# Patient Record
Sex: Female | Born: 2000 | Race: Black or African American | Hispanic: No | Marital: Single | Smoking: Never smoker
Health system: Southern US, Community
[De-identification: ages and names within clinical notes are randomized; demographics above are authoritative.]

## PROBLEM LIST (undated history)

## (undated) HISTORY — PX: OTHER SURGICAL HISTORY: SHX169

## (undated) HISTORY — PX: EYE SURGERY: SHX253

## (undated) HISTORY — PX: TONSILLECTOMY: SUR1361

---
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
## 2020-01-13 ENCOUNTER — Other Ambulatory Visit: Payer: Self-pay

## 2020-01-13 ENCOUNTER — Encounter: Payer: BC Managed Care – PPO | Admitting: Family

## 2020-01-19 ENCOUNTER — Ambulatory Visit: Payer: BC Managed Care – PPO | Admitting: Allergy

## 2020-02-17 ENCOUNTER — Other Ambulatory Visit: Payer: Self-pay

## 2020-02-17 ENCOUNTER — Ambulatory Visit (INDEPENDENT_AMBULATORY_CARE_PROVIDER_SITE_OTHER): Payer: BC Managed Care – PPO | Admitting: Family

## 2020-02-17 VITALS — BP 111/66 | HR 73 | Temp 97.9°F | Resp 16 | Ht 69.0 in | Wt 197.0 lb

## 2020-02-17 DIAGNOSIS — Z0000 Encounter for general adult medical examination without abnormal findings: Secondary | ICD-10-CM

## 2020-02-17 DIAGNOSIS — J358 Other chronic diseases of tonsils and adenoids: Secondary | ICD-10-CM

## 2020-02-17 MED ORDER — BENZOYL PEROXIDE-ERYTHROMYCIN 5-3 % EX GEL: Freq: Two times a day (BID) | CUTANEOUS | 3 refills | Status: DC

## 2020-02-17 NOTE — Progress Notes (Signed)
Subjective:    Patient ID: Elizabeth Harmon, female    DOB: [birth year], 19 y.o.   MRN: [id number]  HPI  Patient is a 19 yr old female who presents today for cpx.  Immunizations: declines flu Diet: fair Wt Readings from Last 3 Encounters:  02/17/20 197 lb (89.4 kg) (97 %, Z= 1.91)*  10/01/19 207 lb (93.9 kg) (98 %, Z= 2.05)*  01/14/19 187 lb 9.6 oz (85.1 kg) (96 %, Z= 1.80)*   * Growth percentiles are based on CDC (Girls, 2-20 Years) data.   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  tonsil stones, hurts to swallow sometimes. Would like further evaluation.   Review of Systems  Constitutional: Negative for unexpected weight change.  HENT: Negative for hearing loss and rhinorrhea.   Eyes: Negative for visual disturbance.  Respiratory: Negative for cough and shortness of breath.   Cardiovascular: Negative for chest pain.  Gastrointestinal: Negative for constipation and diarrhea.  Genitourinary: Negative for dysuria, frequency and hematuria.  Musculoskeletal: Negative for arthralgias and myalgias.  Skin: Negative for rash.  Neurological: Positive for headaches (reports headaches every other day).  Hematological: Negative for adenopathy.  Psychiatric/Behavioral:       Denies depression/anxiety       No past medical history on file.   Social History   Socioeconomic History  . Marital status: Single    Spouse name: Not on file  . Number of children: Not on file  . Years of education: Not on file  . Highest education level: Not on file  Occupational History  . Not on file  Tobacco Use  . Smoking status: Never Smoker  . Smokeless tobacco: Never Used  Vaping Use  . Vaping Use: Never used  Substance and Sexual Activity  . Alcohol use: No  . Drug use: No  . Sexual activity: Not on file  Other Topics Concern  . Not on file  Social  History Narrative      Dad lives at home   She is a Consulting civil engineer at [employer], she is studying biochemistry   Enjoys television, basketball, reading   Does well in school   Social Determinants of Health   Financial Resource Strain:   . Difficulty of Paying Living Expenses: Not on file  Food Insecurity:   . Worried About Programme researcher, broadcasting/film/video in the Last Year: Not on file  . Ran Out of Food in the Last Year: Not on file  Transportation Needs:   . Lack of Transportation (Medical): Not on file  . Lack of Transportation (Non-Medical): Not on file  Physical Activity:   . Days of Exercise per Week: Not on file  . Minutes of Exercise per Session: Not on file  Stress:   . Feeling of Stress : Not on file  Social Connections:   . Frequency of Communication with Friends and Family: Not on file  . Frequency of Social Gatherings with Friends and Family: Not on file  . Attends Religious Services: Not on file  . Active Member of Clubs or Organizations: Not on file  . Attends Banker Meetings: Not on file  . Marital Status: Not on file  Intimate Partner Violence:   . Fear of Current or Ex-Partner: Not on file  . Emotionally Abused: Not on file  . Physically Abused: Not on file  . Sexually Abused: Not on file  Past Surgical History:  Procedure Laterality Date  . EYE SURGERY    . OTHER SURGICAL HISTORY Bilateral    tear duct     Family History  Problem Relation Age of Onset  . Hyperthyroidism Mother   . Diabetes Father   . Hyperlipidemia Father   . Hypertension Father   . Heart attack Maternal Uncle   . Heart attack Paternal Uncle   . Cancer Paternal Uncle        lung  . Cancer Paternal Aunt        lung  . Cancer Maternal Grandfather        prostate  . Arthritis Maternal Grandfather   . Sudden death Neg Hx     No Known Allergies  Current Outpatient Medications on File Prior to Visit  Medication Sig Dispense Refill  . norethindrone-ethinyl estradiol (TARINA FE  1/20) 1-20 MG-MCG tablet Take 1 tablet by mouth daily.    Marland Kitchen EPINEPHrine 0.3 mg/0.3 mL IJ SOAJ injection Inject 0.3 mLs (0.3 mg total) into the muscle as needed for anaphylaxis. 2 each 0   No current facility-administered medications on file prior to visit.    BP 111/66 (BP Location: Left Arm, Patient Position: Sitting, Cuff Size: Small)   Pulse 73   Temp 97.9 F (36.6 C) (Oral)   Resp 16   Ht 5\' 9"  (1.753 m)   Wt 197 lb (89.4 kg)   SpO2 100%   BMI 29.09 kg/m    Objective:   Physical Exam  Physical Exam  Constitutional: She is oriented to person, place, and time. She appears well-developed and well-nourished. No distress.  HENT:  Head: Normocephalic and atraumatic.  Right Ear: Tympanic membrane and ear canal normal.  Left Ear: Tympanic membrane and ear canal normal.  Mouth/Throat: Oropharynx is clear and moist. 2-3+ tonsils bilaterally- no tonsil stones noted today Eyes: Pupils are equal, round, and reactive to light. No scleral icterus.  Neck: Normal range of motion. No thyromegaly present.  Cardiovascular: Normal rate and regular rhythm.   No murmur heard. Pulmonary/Chest: Effort normal and breath sounds normal. No respiratory distress. He has no wheezes. She has no rales. She exhibits no tenderness.  Abdominal: Soft. Bowel sounds are normal. She exhibits no distension and no mass. There is no tenderness. There is no rebound and no guarding.  Musculoskeletal: She exhibits no edema.  Lymphadenopathy:    She has no cervical adenopathy.  Neurological: She is alert and oriented to person, place, and time. She has normal patellar reflexes. She exhibits normal muscle tone. Coordination normal.  Skin: Skin is warm and dry.  Psychiatric: She has a normal mood and affect. Her behavior is normal. Judgment and thought content normal.  Breast/pelvic: deferred       Assessment & Plan:  Preventative care- discussed healthy diet, encouraged her to continue regular exercise. Pt  counseled on the importance of covid vaccination.  I think that her skipping meals may be contributing to her headaches. Reports that she had chlamydia/gonorrhea screen at school last month and it was negative.   Tonsil stone- refer to ENT in [place] near her college.   This visit occurred during the SARS-CoV-2 public health emergency.  Safety protocols were in place, including screening questions prior to the visit, additional usage of staff PPE, and extensive cleaning of exam room while observing appropriate contact time as indicated for disinfecting solutions.         Assessment & Plan:

## 2020-02-17 NOTE — Patient Instructions (Signed)
Preventive Care 18-19 Years Old, Female Preventive care refers to lifestyle choices and visits with your health care provider that can promote health and wellness. At this stage in your life, you may start seeing a primary care physician instead of a pediatrician. Your health care is now your responsibility. Preventive care for young adults includes:  A yearly physical exam. This is also called an annual wellness visit.  Regular dental and eye exams.  Immunizations.  Screening for certain conditions.  Healthy lifestyle choices, such as diet and exercise. What can I expect for my preventive care visit? Physical exam Your health care provider may check:  Height and weight. These may be used to calculate body mass index (BMI), which is a measurement that tells if you are at a healthy weight.  Heart rate and blood pressure.  Body temperature. Counseling Your health care provider may ask you questions about:  Past medical problems and family medical history.  Alcohol, tobacco, and drug use.  Home and relationship well-being.  Access to firearms.  Emotional well-being.  Diet, exercise, and sleep habits.  Sexual activity and sexual health.  Method of birth control.  Menstrual cycle.  Pregnancy history. What immunizations do I need?  Influenza (flu) vaccine  This is recommended every year. Tetanus, diphtheria, and pertussis (Tdap) vaccine  You may need a Td booster every 10 years. Varicella (chickenpox) vaccine  You may need this vaccine if you have not already been vaccinated. Human papillomavirus (HPV) vaccine  If recommended by your health care provider, you may need three doses over 6 months. Measles, mumps, and rubella (MMR) vaccine  You may need at least one dose of MMR. You may also need a second dose. Meningococcal conjugate (MenACWY) vaccine  One dose is recommended if you are 19-19 years old and a first-year college student living in a residence hall,  or if you have one of several medical conditions. You may also need additional booster doses. Pneumococcal conjugate (PCV13) vaccine  You may need this if you have certain conditions and were not previously vaccinated. Pneumococcal polysaccharide (PPSV23) vaccine  You may need one or two doses if you smoke cigarettes or if you have certain conditions. Hepatitis A vaccine  You may need this if you have certain conditions or if you travel or work in places where you may be exposed to hepatitis A. Hepatitis B vaccine  You may need this if you have certain conditions or if you travel or work in places where you may be exposed to hepatitis B. Haemophilus influenzae type b (Hib) vaccine  You may need this if you have certain risk factors. You may receive vaccines as individual doses or as more than one vaccine together in one shot (combination vaccines). Talk with your health care provider about the risks and benefits of combination vaccines. What tests do I need? Blood tests  Lipid and cholesterol levels. These may be checked every 5 years starting at age 20.  Hepatitis C test.  Hepatitis B test. Screening  Pelvic exam and Pap test. This may be done every 3 years starting at age 19.  Sexually transmitted disease (STD) testing, if you are at risk.  BRCA-related cancer screening. This may be done if you have a family history of breast, ovarian, tubal, or peritoneal cancers. Other tests  Tuberculosis skin test.  Vision and hearing tests.  Skin exam.  Breast exam. Follow these instructions at home: Eating and drinking   Eat a diet that includes fresh fruits and   vegetables, whole grains, lean protein, and low-fat dairy products.  Drink enough fluid to keep your urine pale yellow.  Do not drink alcohol if: ? Your health care provider tells you not to drink. ? You are pregnant, may be pregnant, or are planning to become pregnant. ? You are under the legal drinking age. In the  [place] the legal drinking age is 36.  If you drink alcohol: ? Limit how much you have to 0-1 drink a day. ? Be aware of how much alcohol is in your drink. In the [place] one drink equals one 12 oz bottle of beer (355 mL), one 5 oz glass of wine (148 mL), or one 1 oz glass of hard liquor (44 mL). Lifestyle  Take daily care of your teeth and gums.  Stay active. Exercise at least 30 minutes 5 or more days of the week.  Do not use any products that contain nicotine or tobacco, such as cigarettes, e-cigarettes, and chewing tobacco. If you need help quitting, ask your health care provider.  Do not use drugs.  If you are sexually active, practice safe sex. Use a condom or other form of birth control (contraception) in order to prevent pregnancy and STIs (sexually transmitted infections). If you plan to become pregnant, see your health care provider for a pre-conception visit.  Find healthy ways to cope with stress, such as: ? Meditation, yoga, or listening to music. ? Journaling. ? Talking to a trusted person. ? Spending time with friends and family. Safety  Always wear your seat belt while driving or riding in a vehicle.  Do not drive if you have been drinking alcohol. Do not ride with someone who has been drinking.  Do not drive when you are tired or distracted. Do not text while driving.  Wear a helmet and other protective equipment during sports activities.  If you have firearms in your house, make sure you follow all gun safety procedures.  Seek help if you have been bullied, physically abused, or sexually abused.  Use the Internet responsibly to avoid dangers such as online bullying and online sex predators. What's next?  Go to your health care provider once a year for a well check visit.  Ask your health care provider how often you should have your eyes and teeth checked.  Stay up to date on all vaccines. This information is not intended to replace advice given to you by  your health care provider. Make sure you discuss any questions you have with your health care provider. Document Revised: 04/01/2018 Document Reviewed: 04/01/2018 Elsevier Patient Education  2020 [employer].

## 2020-04-03 DIAGNOSIS — J351 Hypertrophy of tonsils: Secondary | ICD-10-CM | POA: Diagnosis not present

## 2020-04-03 DIAGNOSIS — J358 Other chronic diseases of tonsils and adenoids: Secondary | ICD-10-CM | POA: Diagnosis not present

## 2020-04-06 ENCOUNTER — Other Ambulatory Visit: Payer: Self-pay | Admitting: Otolaryngology

## 2020-04-07 DIAGNOSIS — M7542 Impingement syndrome of left shoulder: Secondary | ICD-10-CM | POA: Diagnosis not present

## 2020-04-11 ENCOUNTER — Other Ambulatory Visit: Payer: Self-pay

## 2020-04-11 ENCOUNTER — Encounter (HOSPITAL_BASED_OUTPATIENT_CLINIC_OR_DEPARTMENT_OTHER): Payer: Self-pay | Admitting: Otolaryngology

## 2020-04-16 ENCOUNTER — Inpatient Hospital Stay (HOSPITAL_COMMUNITY)
Admission: RE | Admit: 2020-04-16 | Discharge: 2020-04-16 | Disposition: A | Discharge status: Home / Self Care | Payer: BC Managed Care – PPO | Source: Ambulatory Visit

## 2020-04-18 NOTE — Progress Notes (Signed)
Pt came through testing drive thru 57/26/20 for pre procedure covid testing. 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 Copy of test forwarded to Gae Dry, RN at [facility].

## 2020-04-18 NOTE — Anesthesia Preprocedure Evaluation (Addendum)
Anesthesia Evaluation  Patient identified by MRN, date of birth, ID band Patient awake    Reviewed: Allergy & Precautions, NPO status , Patient's Chart, lab work & pertinent test results  Airway Mallampati: II  TM Distance: >3 FB Neck ROM: Full    Dental no notable dental hx. (+) Teeth Intact, Dental Advisory Given   Pulmonary neg pulmonary ROS,    Pulmonary exam normal breath sounds clear to auscultation       Cardiovascular Exercise Tolerance: Good negative cardio ROS Normal cardiovascular exam Rhythm:Regular Rate:Normal     Neuro/Psych  Headaches, negative psych ROS   GI/Hepatic negative GI ROS, Neg liver ROS,   Endo/Other  negative endocrine ROS  Renal/GU negative Renal ROS     Musculoskeletal negative musculoskeletal ROS (+)   Abdominal (+) - obese,   Peds  Hematology negative hematology ROS (+)   Anesthesia Other Findings   Reproductive/Obstetrics                            Anesthesia Physical Anesthesia Plan  ASA: II  Anesthesia Plan: General   Post-op Pain Management:    Induction: Intravenous  PONV Risk Score and Plan: Treatment may vary due to age or medical condition  Airway Management Planned: Oral ETT  Additional Equipment: None  Intra-op Plan:   Post-operative Plan: Extubation in OR  Informed Consent: I have reviewed the patients History and Physical, chart, labs and discussed the procedure including the risks, benefits and alternatives for the proposed anesthesia with the patient or authorized representative who has indicated his/her understanding and acceptance.     Dental advisory given  Plan Discussed with: CRNA and Anesthesiologist  Anesthesia Plan Comments:        Anesthesia Quick Evaluation

## 2020-04-19 ENCOUNTER — Ambulatory Visit (HOSPITAL_BASED_OUTPATIENT_CLINIC_OR_DEPARTMENT_OTHER): Payer: BC Managed Care – PPO | Admitting: Anesthesiology

## 2020-04-19 ENCOUNTER — Other Ambulatory Visit: Payer: Self-pay

## 2020-04-19 ENCOUNTER — Encounter (HOSPITAL_BASED_OUTPATIENT_CLINIC_OR_DEPARTMENT_OTHER): Payer: Self-pay | Admitting: Otolaryngology

## 2020-04-19 ENCOUNTER — Ambulatory Visit (HOSPITAL_BASED_OUTPATIENT_CLINIC_OR_DEPARTMENT_OTHER)
Admission: RE | Admit: 2020-04-19 | Discharge: 2020-04-19 | Disposition: A | Discharge status: Home / Self Care | Payer: BC Managed Care – PPO | Attending: Otolaryngology | Admitting: Otolaryngology

## 2020-04-19 ENCOUNTER — Encounter (HOSPITAL_BASED_OUTPATIENT_CLINIC_OR_DEPARTMENT_OTHER)
Admission: RE | Disposition: A | Discharge status: Home / Self Care | Payer: Self-pay | Source: Home / Self Care | Attending: Otolaryngology

## 2020-04-19 DIAGNOSIS — Z7952 Long term (current) use of systemic steroids: Secondary | ICD-10-CM | POA: Insufficient documentation

## 2020-04-19 DIAGNOSIS — Z79899 Other long term (current) drug therapy: Secondary | ICD-10-CM | POA: Insufficient documentation

## 2020-04-19 DIAGNOSIS — J3501 Chronic tonsillitis: Secondary | ICD-10-CM | POA: Insufficient documentation

## 2020-04-19 DIAGNOSIS — Z793 Long term (current) use of hormonal contraceptives: Secondary | ICD-10-CM | POA: Insufficient documentation

## 2020-04-19 DIAGNOSIS — G43909 Migraine, unspecified, not intractable, without status migrainosus: Secondary | ICD-10-CM | POA: Diagnosis not present

## 2020-04-19 DIAGNOSIS — J351 Hypertrophy of tonsils: Secondary | ICD-10-CM | POA: Diagnosis not present

## 2020-04-19 HISTORY — PX: TONSILLECTOMY: SHX5217

## 2020-04-19 LAB — POCT PREGNANCY, URINE: Preg Test, Ur: NEGATIVE

## 2020-04-19 SURGERY — TONSILLECTOMY
Anesthesia: General | Site: Mouth | Laterality: Bilateral

## 2020-04-19 MED ORDER — LIDOCAINE 2% (20 MG/ML) 5 ML SYRINGE
INTRAMUSCULAR | Status: AC
  Filled 2020-04-19: qty 5

## 2020-04-19 MED ORDER — MIDAZOLAM HCL 2 MG/2ML IJ SOLN
INTRAMUSCULAR | Status: AC
  Filled 2020-04-19: qty 2

## 2020-04-19 MED ORDER — HYDROMORPHONE HCL 1 MG/ML IJ SOLN
INTRAMUSCULAR | Status: AC
  Filled 2020-04-19: qty 0.5

## 2020-04-19 MED ORDER — DOCUSATE SODIUM 100 MG PO CAPS: 100.0000 mg | ORAL_CAPSULE | Freq: Every day | ORAL | 2 refills | Status: DC | PRN

## 2020-04-19 MED ORDER — FENTANYL CITRATE (PF) 100 MCG/2ML IJ SOLN
INTRAMUSCULAR | Status: AC
  Filled 2020-04-19: qty 2

## 2020-04-19 MED ORDER — DEXMEDETOMIDINE (PRECEDEX) IN NS 20 MCG/5ML (4 MCG/ML) IV SYRINGE
PREFILLED_SYRINGE | INTRAVENOUS | Status: AC
  Filled 2020-04-19: qty 5

## 2020-04-19 MED ORDER — ROCURONIUM BROMIDE 100 MG/10ML IV SOLN
INTRAVENOUS | Status: DC | PRN
  Administered 2020-04-19: 60 mg via INTRAVENOUS

## 2020-04-19 MED ORDER — PROPOFOL 10 MG/ML IV BOLUS
INTRAVENOUS | Status: DC | PRN
  Administered 2020-04-19: 200 mg via INTRAVENOUS

## 2020-04-19 MED ORDER — OXYMETAZOLINE HCL 0.05 % NA SOLN
NASAL | Status: AC
  Filled 2020-04-19: qty 30

## 2020-04-19 MED ORDER — FENTANYL CITRATE (PF) 100 MCG/2ML IJ SOLN
INTRAMUSCULAR | Status: DC | PRN
  Administered 2020-04-19: 100 ug via INTRAVENOUS

## 2020-04-19 MED ORDER — ACETAMINOPHEN 500 MG PO TABS
ORAL_TABLET | ORAL | Status: AC
  Filled 2020-04-19: qty 2

## 2020-04-19 MED ORDER — BACITRACIN-NEOMYCIN-POLYMYXIN OINTMENT TUBE
TOPICAL_OINTMENT | CUTANEOUS | Status: AC
  Filled 2020-04-19: qty 14.17

## 2020-04-19 MED ORDER — HYDROCODONE-ACETAMINOPHEN 7.5-325 MG/15ML PO SOLN: 10.0000 mL | ORAL | Status: DC | PRN

## 2020-04-19 MED ORDER — OXYCODONE HCL 5 MG/5ML PO SOLN: 5.0000 mg | Freq: Once | ORAL | Status: DC | PRN

## 2020-04-19 MED ORDER — ONDANSETRON HCL 4 MG/2ML IJ SOLN
INTRAMUSCULAR | Status: AC
  Filled 2020-04-19: qty 2

## 2020-04-19 MED ORDER — MIDAZOLAM HCL 5 MG/5ML IJ SOLN
INTRAMUSCULAR | Status: DC | PRN
  Administered 2020-04-19: 2 mg via INTRAVENOUS

## 2020-04-19 MED ORDER — MEPERIDINE HCL 25 MG/ML IJ SOLN
6.2500 mg | INTRAMUSCULAR | Status: DC | PRN
Start: 2020-04-19 — End: 2020-04-19

## 2020-04-19 MED ORDER — HYDROCODONE-ACETAMINOPHEN 7.5-325 MG/15ML PO SOLN: 10.0000 mL | ORAL | 0 refills | Status: AC | PRN

## 2020-04-19 MED ORDER — DEXAMETHASONE SODIUM PHOSPHATE 4 MG/ML IJ SOLN
INTRAMUSCULAR | Status: DC | PRN
  Administered 2020-04-19: 10 mg via INTRAVENOUS

## 2020-04-19 MED ORDER — BUPIVACAINE HCL (PF) 0.25 % IJ SOLN
INTRAMUSCULAR | Status: AC
  Filled 2020-04-19: qty 90

## 2020-04-19 MED ORDER — ACETAMINOPHEN 500 MG PO TABS
1000.0000 mg | ORAL_TABLET | Freq: Once | ORAL | Status: AC
  Administered 2020-04-19: 1000 mg via ORAL

## 2020-04-19 MED ORDER — ONDANSETRON HCL 4 MG/2ML IJ SOLN
INTRAMUSCULAR | Status: DC | PRN
  Administered 2020-04-19: 4 mg via INTRAVENOUS

## 2020-04-19 MED ORDER — SUGAMMADEX SODIUM 200 MG/2ML IV SOLN
INTRAVENOUS | Status: DC | PRN
  Administered 2020-04-19: 200 mg via INTRAVENOUS

## 2020-04-19 MED ORDER — HYDROMORPHONE HCL 1 MG/ML IJ SOLN
0.2500 mg | INTRAMUSCULAR | Status: DC | PRN
  Administered 2020-04-19: 0.5 mg via INTRAVENOUS

## 2020-04-19 MED ORDER — AMISULPRIDE (ANTIEMETIC) 5 MG/2ML IV SOLN: 10.0000 mg | Freq: Once | INTRAVENOUS | Status: DC | PRN

## 2020-04-19 MED ORDER — DEXAMETHASONE SODIUM PHOSPHATE 10 MG/ML IJ SOLN
INTRAMUSCULAR | Status: AC
  Filled 2020-04-19: qty 1

## 2020-04-19 MED ORDER — ONDANSETRON HCL 4 MG/2ML IJ SOLN: 4.0000 mg | Freq: Once | INTRAMUSCULAR | Status: DC | PRN

## 2020-04-19 MED ORDER — LACTATED RINGERS IV SOLN: INTRAVENOUS | Status: DC

## 2020-04-19 MED ORDER — LIDOCAINE HCL (CARDIAC) PF 100 MG/5ML IV SOSY
PREFILLED_SYRINGE | INTRAVENOUS | Status: DC | PRN
  Administered 2020-04-19: 100 mg via INTRAVENOUS

## 2020-04-19 MED ORDER — OXYCODONE HCL 5 MG PO TABS: 5.0000 mg | ORAL_TABLET | Freq: Once | ORAL | Status: DC | PRN

## 2020-04-19 MED ORDER — DEXMEDETOMIDINE HCL 200 MCG/2ML IV SOLN
INTRAVENOUS | Status: DC | PRN
  Administered 2020-04-19: 8 ug via INTRAVENOUS
  Administered 2020-04-19: 12 ug via INTRAVENOUS

## 2020-04-19 MED ORDER — PROPOFOL 10 MG/ML IV BOLUS
INTRAVENOUS | Status: AC
  Filled 2020-04-19: qty 40

## 2020-04-19 MED ORDER — IBUPROFEN 100 MG/5ML PO SUSP: 400.0000 mg | Freq: Four times a day (QID) | ORAL | Status: DC | PRN

## 2020-04-19 SURGICAL SUPPLY — 32 items
CANISTER SUCT 1200ML W/VALVE (MISCELLANEOUS) ×2 IMPLANT
CATH ROBINSON RED A/P 12FR (CATHETERS) ×2 IMPLANT
CLEANER CAUTERY TIP 5X5 PAD (MISCELLANEOUS) IMPLANT
COAGULATOR SUCT SWTCH 10FR 6 (ELECTROSURGICAL) ×2 IMPLANT
COVER BACK TABLE 60X90IN (DRAPES) ×2 IMPLANT
COVER MAYO STAND STRL (DRAPES) ×2 IMPLANT
COVER WAND RF STERILE (DRAPES) IMPLANT
ELECT COATED BLADE 2.86 ST (ELECTRODE) ×2 IMPLANT
ELECT REM PT RETURN 9FT ADLT (ELECTROSURGICAL) ×2
ELECT REM PT RETURN 9FT PED (ELECTROSURGICAL)
ELECTRODE REM PT RETRN 9FT PED (ELECTROSURGICAL) IMPLANT
ELECTRODE REM PT RTRN 9FT ADLT (ELECTROSURGICAL) ×1 IMPLANT
GAUZE SPONGE 4X4 12PLY STRL LF (GAUZE/BANDAGES/DRESSINGS) ×4 IMPLANT
GLOVE BIOGEL PI IND STRL 7.0 (GLOVE) IMPLANT
GLOVE BIOGEL PI INDICATOR 7.0 (GLOVE) ×1
GLOVE SURG ENC MOIS LTX SZ6.5 (GLOVE) ×2 IMPLANT
GOWN STRL REUS W/ TWL LRG LVL3 (GOWN DISPOSABLE) ×2 IMPLANT
GOWN STRL REUS W/TWL LRG LVL3 (GOWN DISPOSABLE) ×4
MARKER SKIN DUAL TIP RULER LAB (MISCELLANEOUS) IMPLANT
NS IRRIG 1000ML POUR BTL (IV SOLUTION) ×2 IMPLANT
PAD CLEANER CAUTERY TIP 5X5 (MISCELLANEOUS)
PENCIL SMOKE EVACUATOR (MISCELLANEOUS) ×2 IMPLANT
SHEET MEDIUM DRAPE 40X70 STRL (DRAPES) ×2 IMPLANT
SOLUTION BUTLER CLEAR DIP (MISCELLANEOUS) ×2 IMPLANT
SPONGE TONSIL TAPE 1 RFD (DISPOSABLE) IMPLANT
SPONGE TONSIL TAPE 1.25 RFD (DISPOSABLE) ×1 IMPLANT
SYR BULB EAR ULCER 3OZ GRN STR (SYRINGE) ×2 IMPLANT
TOWEL GREEN STERILE FF (TOWEL DISPOSABLE) ×2 IMPLANT
TUBE CONNECTING 20X1/4 (TUBING) ×2 IMPLANT
TUBE SALEM SUMP 12R W/ARV (TUBING) IMPLANT
TUBE SALEM SUMP 16 FR W/ARV (TUBING) ×1 IMPLANT
YANKAUER SUCT BULB TIP NO VENT (SUCTIONS) ×2 IMPLANT

## 2020-04-19 NOTE — H&P (Signed)
Elizabeth Harmon is an 19 y.o. female.    Chief Complaint:  Chronic tonsillitis  HPI: Patient presents today for planned elective procedure.  She was most recently seen in our office approximately 2 weeks ago with complaints of.  Recurrent sore throat and tonsillitis.  She also suffers with frequent halitosis and odynophagia secondary to tonsil stones.  She denies any change to her history.  History reviewed. No pertinent past medical history.  Past Surgical History:  Procedure Laterality Date  . EYE SURGERY    . OTHER SURGICAL HISTORY Bilateral    tear duct     Family History  Problem Relation Age of Onset  . Hyperthyroidism Mother   . Diabetes Father   . Hyperlipidemia Father   . Hypertension Father   . Heart attack Maternal Uncle   . Heart attack Paternal Uncle   . Cancer Paternal Uncle        lung  . Cancer Paternal Aunt        lung  . Cancer Maternal Grandfather        prostate  . Arthritis Maternal Grandfather   . Sudden death Neg Hx     Social History:  reports that she has never smoked. She has never used smokeless tobacco. She reports that she does not drink alcohol and does not use drugs.  Allergies: No Known Allergies  Medications Prior to Admission  Medication Sig Dispense Refill  . naproxen (NAPROSYN) 500 MG tablet Take 500 mg by mouth 2 (two) times daily with a meal.    . norethindrone-ethinyl estradiol (LOESTRIN FE) 1-20 MG-MCG tablet Take 1 tablet by mouth daily.    . predniSONE (DELTASONE) 20 MG tablet Take 20 mg by mouth 2 (two) times daily with a meal.    . benzoyl peroxide-erythromycin (BENZAMYCIN) gel Apply topically 2 (two) times daily. 23.3 g 3  . EPINEPHrine 0.3 mg/0.3 mL IJ SOAJ injection Inject 0.3 mLs (0.3 mg total) into the muscle as needed for anaphylaxis. 2 each 0    Results for orders placed or performed during the hospital encounter of 04/19/20 (from the past 48 hour(s))  Pregnancy, urine POC     Status: None   Collection Time: 04/19/20   6:53 AM  Result Value Ref Range   Preg Test, Ur NEGATIVE NEGATIVE    Comment:        THE SENSITIVITY OF THIS METHODOLOGY IS >24 mIU/mL    No results found.  ROS: ROS  Blood pressure 121/67, pulse 83, temperature 98.9 F (37.2 C), temperature source Oral, resp. rate 16, height 5\' 9"  (1.753 m), weight 90.5 kg, last menstrual period 04/12/2020, SpO2 100 %.  PHYSICAL EXAM: Constitutional:  Patient appears well-nourished and well-developed. No acute distress. Head/Face: Facial features are symmetric. Skull is normocephalic.  Eyes: Conjunctiva and lids are normal. Normal extraocular mobility. Normal vision by patient report. Ears:  Right: Pinna and external meatus normal,  Left: Pinna and external meatus normal Oral cavity/Oropharynx: Lips normal, teeth and gums normal with good dentition, normal oral vestibule. Normal floor of mouth, tongue and oral mucosa, no mucosal lesions, ulcer or mass, normal tongue mobility.  Hard and soft palate normal with normal mobility. 3+ tonsillar hypertrophy, moderately cryptic. No erythema or exudate. Neck: No cervical lymphadenopathy, mass or swelling. Neurological: Alert and oriented to self, place and time. Normal reflexes and motor skills, balance and coordination. Psychiatric: No unusual anxiety or evidence of depression. Appropriate affect.   Studies Reviewed: None   Assessment/Plan Elizabeth Harmon is a  19 y/o F with chronic tonsillitis, tonsillar hypertrophy  -To OR today for planned surgical procedure.  Risk benefits and expected recovery discussed with patient.  She is agreeable.  Consent signed and placed in chart.    Elizabeth Harmon A Laronn Devonshire 04/19/2020, 8:08 AM

## 2020-04-19 NOTE — Anesthesia Postprocedure Evaluation (Signed)
Anesthesia Post Note  Patient: Elizabeth Harmon  Procedure(s) Performed: TONSILLECTOMY (Bilateral Mouth)     Patient location during evaluation: PACU Anesthesia Type: General Level of consciousness: awake and alert Pain management: pain level controlled Vital Signs Assessment: post-procedure vital signs reviewed and stable Respiratory status: spontaneous breathing, nonlabored ventilation, respiratory function stable and patient connected to nasal cannula oxygen Cardiovascular status: blood pressure returned to baseline and stable Postop Assessment: no apparent nausea or vomiting Anesthetic complications: no   No complications documented.  Last Vitals:  Vitals:   04/19/20 0945 04/19/20 1000  BP: 124/73 118/66  Pulse: 87 79  Resp: 13 15  Temp:    SpO2: 99% 99%    Last Pain:  Vitals:   04/19/20 1015  TempSrc:   PainSc: 0-No pain                 Trevor Iha

## 2020-04-19 NOTE — Transfer of Care (Signed)
Immediate Anesthesia Transfer of Care Note  Patient: Elizabeth Harmon  Procedure(s) Performed: TONSILLECTOMY (Bilateral Mouth)  Patient Location: PACU  Anesthesia Type:General  Level of Consciousness: awake, alert  and oriented  Airway & Oxygen Therapy: Patient Spontanous Breathing and Patient connected to face mask oxygen  Post-op Assessment: Report given to RN and Post -op Vital signs reviewed and stable  Post vital signs: Reviewed and stable  Last Vitals:  Vitals Value Taken Time  BP 118/66 04/19/20 1000  Temp 36.6 C 04/19/20 0906  Pulse 73 04/19/20 1000  Resp 18 04/19/20 1000  SpO2 100 % 04/19/20 1000  Vitals shown include unvalidated device data.  Last Pain:  Vitals:   04/19/20 0952  TempSrc:   PainSc: 3       Patients Stated Pain Goal: 1 (04/19/20 0930)  Complications: No complications documented.

## 2020-04-19 NOTE — Anesthesia Procedure Notes (Signed)
Procedure Name: Intubation Performed by: Verita Lamb, CRNA Pre-anesthesia Checklist: Patient identified, Emergency Drugs available, Suction available and Patient being monitored Patient Re-evaluated:Patient Re-evaluated prior to induction Oxygen Delivery Method: Circle system utilized Preoxygenation: Pre-oxygenation with 100% oxygen Induction Type: IV induction Ventilation: Mask ventilation without difficulty Laryngoscope Size: Mac and 3 Grade View: Grade I Tube type: Oral Tube size: 7.0 mm Number of attempts: 1 Airway Equipment and Method: Stylet and Oral airway Placement Confirmation: ETT inserted through vocal cords under direct vision,  positive ETCO2 and breath sounds checked- equal and bilateral Secured at: 22 cm Tube secured with: Tape Dental Injury: Teeth and Oropharynx as per pre-operative assessment

## 2020-04-19 NOTE — Op Note (Signed)
OPERATIVE NOTE  Shiane Wenberg Date/Time of Admission: 04/19/2020  6:36 AM  CSN: [id number];MRN:[id number] Attending Provider: Cheron Schaumann A, DO Room/Bed: [facility]/NONE DOB: 2000/08/30 Age: 19 y.o.   Pre-Op Diagnosis: tonsillar hypertrophy chronic tonsillitis  Post-Op Diagnosis: tonsillar hypertrophy chronic tonsillitis  Procedure: Procedure(s): TONSILLECTOMY  Anesthesia: General  Surgeon(s): Howie Rufus A Analeese Andreatta, DO  Staff: Circulator: Macie Burows, RN Relief Circulator: McDonough-Hughes, Maceo Pro, RN Scrub Person: Patrice Paradise, RN  Implants: * No implants in log *  Specimens: * No specimens in log *  Complications: None  EBL: <5 ML  IVF: See anesthesia log  Condition: stable  Operative Findings:  3+ tonsils, cryptic  Description of Operation: Once operative consent was obtained, and the surgical site confirmed with the operating room team, the patient was brought back to the operating room and general endotracheal anesthesia was obtained. The patient was turned over to the ENT service. A Crow-Davis mouth gag was used to expose the oral cavity and oropharynx. A red rubber catheter was placed from the right nasal cavity to the oral cavity to retract the soft palate. Attention was first turned to the right tonsil, which was excised at the level of the capsule using electrocautery. Hemostasis was obtained and the tonsillar fossa and the tonsil was sent for specimen. The exact procedure was repeated on the left side. The patient was relieved from oral suspension and then placed back in oral suspension to assure hemostasis, which was obtained. An oral gastric tube was placed into the stomach and suctioned to reduce postoperative nausea. The patient was turned back over to the anesthesia service and extubated in the room without event. The patient was then transferred to the PACU in stable condition.    Laren Boom, DO [facility]  04/19/2020

## 2020-04-19 NOTE — Discharge Instructions (Signed)
Next dose of Tylenol can be given after 1:11PM.    Post Anesthesia Home Care Instructions  Activity: Get plenty of rest for the remainder of the day. A responsible individual must stay with you for 24 hours following the procedure.  For the next 24 hours, DO NOT: -Drive a car -Advertising copywriter -Drink alcoholic beverages -Take any medication unless instructed by your physician -Make any legal decisions or sign important papers.  Meals: Start with liquid foods such as gelatin or soup. Progress to regular foods as tolerated. Avoid greasy, spicy, heavy foods. If nausea and/or vomiting occur, drink only clear liquids until the nausea and/or vomiting subsides. Call your physician if vomiting continues.  Special Instructions/Symptoms: Your throat may feel dry or sore from the anesthesia or the breathing tube placed in your throat during surgery. If this causes discomfort, gargle with warm salt water. The discomfort should disappear within 24 hours.  If you had a scopolamine patch placed behind your ear for the management of post- operative nausea and/or vomiting:  1. The medication in the patch is effective for 72 hours, after which it should be removed.  Wrap patch in a tissue and discard in the trash. Wash hands thoroughly with soap and water. 2. You may remove the patch earlier than 72 hours if you experience unpleasant side effects which may include dry mouth, dizziness or visual disturbances. 3. Avoid touching the patch. Wash your hands with soap and water after contact with the patch.        Tonsillectomy Post Operative Instructions   Effects of Anesthesia Tonsillectomy (with or without Adenoidectomy) involves a brief anesthesia,  typically 20 - 60 minutes. Patients may be quite irritable for several hours after  surgery. If sedatives were given, some patients will remain sleepy for much of the  day. Nausea and vomiting is occasionally seen, and usually resolves by the  evening  of surgery - even without additional medications. Medications Tonsillectomy is a painful procedure. Pain medications help but do not  completely alleviate the discomfort.   YOUNGER CHILDREN  Younger children should be given Tylenol Elixir and Motrin Elixir, with  dosing based on weight (see chart below). Start by giving scheduled  Tylenol every 4 hours. If this does not control the pain, you can  ALTERNATE between Tylenol and Motrin and give a dose every 3 hours  (i.e. Tylenol given at 12pm, then Motrin at 3pm then Tylenol at 6pm). Many  children do not like the taste of liquid medications, so you may substitute  Tylenol and Motrin chewables for elixir prescribed. Below are the doses for  both. It is fine to use generic store brands instead of brand name -- [facility] generic has a taste tolerated by most children. You do not  need to wait for your child to complain of pain to give them medication,  scheduled dosing of medications will control the pain more effectively.  OLDER CHILDREN  Older children will be prescribed Lortab Elixir and can use Tylenol Elixir.  You may use ONE OR THE OTHER every 4 hours (DO NOT give them at  the same time). Try giving Tylenol (see chart below for dosing) scheduled  every 4 hours. If the Tylenol Elixir does not help to relieve the pain at all,  then substitute the Lortab Elixir for the next dose. Every time you give a  dose of Lortab Elixir, do so with some food or full liquid to prevent nausea.  The best thing to take with the  medication is a cup of pudding or ice cream,  a milkshake or cup of milk.     ADULTS  Adults will be prescribed a narcotic pain pill or elixir (Percocet, Norco,  Vicodin, Lortab are some examples). Do not use aspirin products (Bayers,  Goode powders, Excedrin) - they may increase the chance of bleeding.  Every time you take a dose of pain medication, do so with some food or full  liquid to prevent nausea. The best thing  to take with the medication is a  cup of pudding or ice cream, a milkshake or cup of milk.   Activity  Vigorous exercise should be avoided for 14 days after surgery. This risk of  bleeding is increased with increased activity and bleeding from where the tonsils  were removed can happen for up to 2 weeks after surgery. Baths and showers are fine. Many patients have reduced energy levels until their pain decreases and  they are taking in more nourishment and calories. You should not travel out of  the local area for a full 2 weeks after surgery in case you experience bleeding  after surgery.   Eating & Drinking Dehydration is the biggest enemy in the recovery period. It will increase the pain,  increase the risk of bleeding and delay the healing. It usually happens because  the pain of swallowing keeps the patient from drinking enough liquids. Therefore,  the key is to force fluids, and that works best when pain control is maximized. You cannot drink too much after having a tonsillectomy. The only drinks to avoid  are citrus like orange and grapefruit juices because they will burn the back of the  throat. Incentive charts with prizes work very well to get young children to drink  fluids and take their medications after surgery. Some patients will have a small  amount of liquid come out of their nose when they drink after surgery, this should  stop within a few weeks after surgery.  Although drinking is more important, eating is fine even the day of surgery but  avoid foods that are crunchy or have sharp edges. Dairy products may be taken,  if desired. You should avoid acidic, salty and spicy foods (especially tomato  sauces). Chewing gum or bubble gum encourages swallowing and saliva flow,  and may even speed up the healing. Almost everyone loses some weight after  tonsillectomy (which is usually regained in the 2nd or 3rd week after surgery).  Drinking is far more important that eating in  the first 14 days after surgery, so  concentrate on that first and foremost. Adequate liquid intake probably speeds  Recovery.  Other things  Pain is usually the worst in the morning; this can be avoided by overnight  medication administration if needed.  Since moisture helps soothe the healing throat, a room humidifier (hot or  cold) is suggested when the patient is sleeping.  Some patients feel pain relief with an ice collar to the neck (or a bag of  frozen peas or corn). Be careful to avoid placing cold plastic directly on the  skin - wrap in a paper towel or washcloth.   If the tonsils and adenoids are very large, the patients voice may change  after surgery.  The recovery from tonsillectomy is a very painful period, often the worst  pain people can recall, so please be understanding and patient with  yourself, or the patient you are caring for. It is helpful to take pain  medicine during the night if the patient awakens-- the worst pain is usually  in the morning. The pain may seem to increase 2-5 days after surgery - this is normal when inflammation sets in. Please be aware that no  combination of medicines will eliminate the pain - the patient will need to  continue eating/drinking in spite of the remaining discomfort.  You should not travel outside of the local area for 14 days after surgery in  case significant bleeding occurs.   What should we expect after surgery? As previously mentioned, most patients have a significant amount of pain after  tonsillectomy, with pain resolving 7-14 days after surgery. Older children and  adults seem to have more discomfort. Most patients can go home the day of  surgery.  Ear pain: Many people will complain of earaches after tonsillectomy. This  is caused by referred pain coming from throat and not the ears. Give pain  medications and encourage liquid intake.  Fever: Many patients have a low-grade fever after tonsillectomy - up to   101.5 degrees (380 C.) for several days. Higher prolonged fever should be  reported to your surgeon.  Bad looking (and bad smelling) throat: After surgery, the place where  the tonsils were removed is covered with a white film, which is a moist  scab. This usually develops 3-5 days after surgery and falls off 10-14 days  after surgery and usually causes bad breath. There will be some redness  and swelling as well. The uvula (the part of the throat that hangs down in  the middle between the tonsils) is usually swollen for several days after  surgery.  Sore/bruised feeling of Tongue: This is common for the first few days  after surgery because the tongue is pushed out of the way to take out the  tonsils in surgery.  When should we call the doctor?  Nausea/Vomiting: This is a common side effect from General Anesthesia  and can last up to 24-36 hours after surgery. Try giving sips of clear liquids  like Sprite, water or apple juice then gradually increase fluid intake. If the  nausea or vomiting continues beyond this time frame, call the doctors  office for medications that will help relieve the nausea and vomiting.  Bleeding: Significant bleeding is rare, but it happens to about 5% of  patients who have tonsillectomy. It may come from the nose, the mouth, or  be vomited or coughed up. Ice water mouthwashes may help stop or  reduce bleeding. If you have bleeding that does not stop, you should call  the office (during business hours) or the on call physician (evenings, weekends) or go to the emergency room if you are very concerned.   Dehydration: If there has been little or no liquids intake for 24 hours, the  patient may need to come to the hospital for IV fluids. Signs of dehydration  include lethargy, the lack of tears when crying, and reduced or very  concentrated urine output.  High Fever: If the patient has a consistent temperatures greater than 102,  or when accompanied by  cough or difficulty breathing, you should call the  doctors office.  If you run out of pain medication: Some patients run out of pain  medications prescribed after surgery. If you need more, call the office DURING BUSINESS HOURS and more will be prescribed. Keep an eye  on your prescription so that you dont run out completely before you can  pick up more, especially  before the weekend

## 2020-04-23 ENCOUNTER — Encounter (HOSPITAL_BASED_OUTPATIENT_CLINIC_OR_DEPARTMENT_OTHER): Payer: Self-pay | Admitting: Otolaryngology

## 2020-04-25 ENCOUNTER — Other Ambulatory Visit: Payer: BC Managed Care – PPO

## 2020-04-25 ENCOUNTER — Other Ambulatory Visit: Payer: Self-pay

## 2020-04-25 DIAGNOSIS — Z20822 Contact with and (suspected) exposure to covid-19: Secondary | ICD-10-CM | POA: Diagnosis not present

## 2020-04-26 ENCOUNTER — Emergency Department (HOSPITAL_BASED_OUTPATIENT_CLINIC_OR_DEPARTMENT_OTHER): Payer: BC Managed Care – PPO

## 2020-04-26 ENCOUNTER — Encounter (HOSPITAL_BASED_OUTPATIENT_CLINIC_OR_DEPARTMENT_OTHER): Payer: Self-pay | Admitting: *Deleted

## 2020-04-26 ENCOUNTER — Emergency Department (HOSPITAL_BASED_OUTPATIENT_CLINIC_OR_DEPARTMENT_OTHER)
Admission: EM | Admit: 2020-04-26 | Discharge: 2020-04-26 | Disposition: A | Discharge status: Home / Self Care | Payer: BC Managed Care – PPO | Attending: Emergency Medicine | Admitting: Emergency Medicine

## 2020-04-26 ENCOUNTER — Other Ambulatory Visit: Payer: Self-pay

## 2020-04-26 DIAGNOSIS — K2900 Acute gastritis without bleeding: Secondary | ICD-10-CM | POA: Diagnosis not present

## 2020-04-26 DIAGNOSIS — R110 Nausea: Secondary | ICD-10-CM | POA: Diagnosis not present

## 2020-04-26 DIAGNOSIS — E860 Dehydration: Secondary | ICD-10-CM | POA: Insufficient documentation

## 2020-04-26 DIAGNOSIS — G8918 Other acute postprocedural pain: Secondary | ICD-10-CM | POA: Insufficient documentation

## 2020-04-26 DIAGNOSIS — R059 Cough, unspecified: Secondary | ICD-10-CM | POA: Diagnosis not present

## 2020-04-26 LAB — URINALYSIS, ROUTINE W REFLEX MICROSCOPIC
Bilirubin Urine: NEGATIVE
Glucose, UA: NEGATIVE mg/dL
Ketones, ur: 80 mg/dL — AB
Leukocytes,Ua: NEGATIVE
Nitrite: NEGATIVE
Protein, ur: 30 mg/dL — AB
Specific Gravity, Urine: 1.03 (ref 1.005–1.030)
pH: 5 (ref 5.0–8.0)

## 2020-04-26 LAB — COMPREHENSIVE METABOLIC PANEL
ALT: 25 U/L (ref 0–44)
AST: 23 U/L (ref 15–41)
Albumin: 4.5 g/dL (ref 3.5–5.0)
Alkaline Phosphatase: 48 U/L (ref 38–126)
Anion gap: 14 (ref 5–15)
BUN: 11 mg/dL (ref 6–20)
CO2: 24 mmol/L (ref 22–32)
Calcium: 9.4 mg/dL (ref 8.9–10.3)
Chloride: 103 mmol/L (ref 98–111)
Creatinine, Ser: 1.1 mg/dL — ABNORMAL HIGH (ref 0.44–1.00)
GFR, Estimated: >60 mL/min (ref >60)
Glucose, Bld: 87 mg/dL (ref 70–99)
Potassium: 3.4 mmol/L — ABNORMAL LOW (ref 3.5–5.1)
Sodium: 141 mmol/L (ref 135–145)
Total Bilirubin: 0.8 mg/dL (ref 0.3–1.2)
Total Protein: 8.4 g/dL — ABNORMAL HIGH (ref 6.5–8.1)

## 2020-04-26 LAB — CBC WITH DIFFERENTIAL/PLATELET
Abs Immature Granulocytes: 0.01 10*3/uL (ref 0.00–0.07)
Basophils Absolute: 0 10*3/uL (ref 0.0–0.1)
Basophils Relative: 1 %
Eosinophils Absolute: 0.1 10*3/uL (ref 0.0–0.5)
Eosinophils Relative: 3 %
HCT: 43.6 % (ref 36.0–46.0)
Hemoglobin: 14.8 g/dL (ref 12.0–15.0)
Immature Granulocytes: 0 %
Lymphocytes Relative: 30 %
Lymphs Abs: 1.2 10*3/uL (ref 0.7–4.0)
MCH: 29 pg (ref 26.0–34.0)
MCHC: 33.9 g/dL (ref 30.0–36.0)
MCV: 85.3 fL (ref 80.0–100.0)
Monocytes Absolute: 0.4 10*3/uL (ref 0.1–1.0)
Monocytes Relative: 11 %
Neutro Abs: 2.2 10*3/uL (ref 1.7–7.7)
Neutrophils Relative %: 55 %
Platelets: 335 10*3/uL (ref 150–400)
RBC: 5.11 MIL/uL (ref 3.87–5.11)
RDW: 12.1 % (ref 11.5–15.5)
WBC: 4 10*3/uL (ref 4.0–10.5)
nRBC: 0 % (ref 0.0–0.2)

## 2020-04-26 LAB — URINALYSIS, MICROSCOPIC (REFLEX): RBC / HPF: >50 RBC/hpf (ref 0–5)

## 2020-04-26 LAB — NOVEL CORONAVIRUS, NAA: SARS-CoV-2, NAA: DETECTED — AB

## 2020-04-26 LAB — HCG, SERUM, QUALITATIVE: Preg, Serum: NEGATIVE

## 2020-04-26 LAB — LACTIC ACID, PLASMA: Lactic Acid, Venous: 1.5 mmol/L (ref 0.5–1.9)

## 2020-04-26 LAB — SARS-COV-2, NAA 2 DAY TAT

## 2020-04-26 LAB — LIPASE, BLOOD: Lipase: 21 U/L (ref 11–51)

## 2020-04-26 MED ORDER — ACETAMINOPHEN 160 MG/5ML PO ELIX: 500.0000 mg | ORAL_SOLUTION | Freq: Four times a day (QID) | ORAL | 1 refills | Status: DC | PRN

## 2020-04-26 MED ORDER — FAMOTIDINE 40 MG/5ML PO SUSR: 20.0000 mg | Freq: Two times a day (BID) | ORAL | 0 refills | Status: DC

## 2020-04-26 MED ORDER — ONDANSETRON HCL 4 MG/2ML IJ SOLN
4.0000 mg | Freq: Once | INTRAMUSCULAR | Status: AC
  Administered 2020-04-26: 4 mg via INTRAVENOUS
  Filled 2020-04-26: qty 2

## 2020-04-26 MED ORDER — HYDROCODONE-HOMATROPINE 5-1.5 MG/5ML PO SYRP: 5.0000 mL | ORAL_SOLUTION | ORAL | 0 refills | Status: DC | PRN

## 2020-04-26 MED ORDER — FAMOTIDINE IN NACL 20-0.9 MG/50ML-% IV SOLN
20.0000 mg | Freq: Once | INTRAVENOUS | Status: AC
  Administered 2020-04-26: 20 mg via INTRAVENOUS
  Filled 2020-04-26: qty 50

## 2020-04-26 MED ORDER — LACTATED RINGERS IV BOLUS
2000.0000 mL | Freq: Once | INTRAVENOUS | Status: AC
  Administered 2020-04-26: 2000 mL via INTRAVENOUS

## 2020-04-26 MED ORDER — ACETAMINOPHEN 160 MG/5ML PO SOLN
500.0000 mg | Freq: Once | ORAL | Status: AC
  Administered 2020-04-26: 500 mg via ORAL
  Filled 2020-04-26: qty 20.3

## 2020-04-26 MED ORDER — ONDANSETRON 4 MG PO TBDP: 4.0000 mg | ORAL_TABLET | ORAL | 0 refills | Status: DC | PRN

## 2020-04-26 NOTE — ED Notes (Signed)
Pt. Has her period at present time

## 2020-04-26 NOTE — ED Provider Notes (Signed)
[facility] EMERGENCY DEPARTMENT Provider Note   CSN: [id number] Arrival date & time: 04/26/20  1356     History Chief Complaint  Patient presents with  . Post-op Problem    Elizabeth Harmon is a 20 y.o. female.  HPI Patient tonsillectomy 1 week ago.  She reports she felt all right the first couple of days.  Subsequently however she started developing a lot of nausea and pain in her ears reports when she got pain in her ears and her throat she could still swallow water but could not eat anything.  She reports that she was trying to take pain medications at that time but her symptoms kept worsening.  She reports she started developing a lot of upper stomach pain.  At that point, she is having to spit out saliva, very nauseated and not able to eat anything.  She reports the symptoms have worsened.  She now has a lot of stabbing and burning pain in her upper abdomen.  She denies she has had a measured fever.  She has had some coughing.  Patient was tested for COVID before she had her surgery.  She has not been vaccinated.    History reviewed. No pertinent past medical history.  Patient Active Problem List   Diagnosis Date Noted  . Irregular menses 04/28/2014  . Migraines 04/28/2014  . Morbid obesity ([place]) 04/28/2014  . Sports physical 03/01/2013    Past Surgical History:  Procedure Laterality Date  . EYE SURGERY    . OTHER SURGICAL HISTORY Bilateral    tear duct   . TONSILLECTOMY Bilateral 04/19/2020   Procedure: TONSILLECTOMY;  Surgeon: Jason Coop, DO;  Location: [facility];  Service: ENT;  Laterality: Bilateral;     OB History   No obstetric history on file.     Family History  Problem Relation Age of Onset  . Hyperthyroidism Mother   . Diabetes Father   . Hyperlipidemia Father   . Hypertension Father   . Heart attack Maternal Uncle   . Heart attack Paternal Uncle   . Cancer Paternal Uncle        lung  . Cancer Paternal Aunt         lung  . Cancer Maternal Grandfather        prostate  . Arthritis Maternal Grandfather   . Sudden death Neg Hx     Social History   Tobacco Use  . Smoking status: Never Smoker  . Smokeless tobacco: Never Used  Vaping Use  . Vaping Use: Never used  Substance Use Topics  . Alcohol use: No  . Drug use: No    Home Medications Prior to Admission medications   Medication Sig Start Date End Date Taking? Authorizing Provider  acetaminophen (TYLENOL) 160 MG/5ML elixir Take 15.6 mLs (500 mg total) by mouth every 6 (six) hours as needed for fever. 04/26/20  Yes Charlesetta Shanks, MD  docusate sodium (COLACE) 100 MG capsule Take 1 capsule (100 mg total) by mouth daily as needed. 04/19/20 04/19/21 Yes Skotnicki, Meghan A, DO  EPINEPHrine 0.3 mg/0.3 mL IJ SOAJ injection Inject 0.3 mLs (0.3 mg total) into the muscle as needed for anaphylaxis. 12/17/18  Yes Debbrah Alar, NP  famotidine (PEPCID) 40 MG/5ML suspension Take 2.5 mLs (20 mg total) by mouth 2 (two) times daily. 04/26/20  Yes Volney Reierson, Jeannie Done, MD  HYDROcodone-homatropine [facility]) 5-1.5 MG/5ML syrup Take 5 mLs by mouth every 4 (four) hours as needed for cough. 04/26/20  Yes Teng Decou,  Lebron Conners, MD  norethindrone-ethinyl estradiol (LOESTRIN FE) 1-20 MG-MCG tablet Take 1 tablet by mouth daily.   Yes [provider]  ondansetron (ZOFRAN ODT) 4 MG disintegrating tablet Take 1 tablet (4 mg total) by mouth every 4 (four) hours as needed for nausea or vomiting. 04/26/20  Yes Arby Barrette, MD  benzoyl peroxide-erythromycin [facility] gel Apply topically 2 (two) times daily. 02/17/20   Sandford Craze, NP  HYDROcodone-acetaminophen (HYCET) 7.5-325 mg/15 ml solution Take 10 mLs by mouth every 4 (four) hours as needed for up to 7 days for moderate pain. 04/19/20 04/26/20  Skotnicki, Meghan A, DO  naproxen (NAPROSYN) 500 MG tablet Take 500 mg by mouth 2 (two) times daily with a meal.    [provider]  predniSONE (DELTASONE) 20 MG tablet  Take 20 mg by mouth 2 (two) times daily with a meal.    [provider]    Allergies    Patient has no known allergies.  Review of Systems   Review of Systems 10 systems reviewed and negative except as per HPI Physical Exam Updated Vital Signs BP 119/64 (BP Location: Right Arm)   Pulse 79   Temp 98.6 F (37 C) (Oral)   Resp 18   Ht 5\' 9"  (1.753 m)   Wt 83.2 kg   LMP 04/12/2020   SpO2 100%   BMI 27.08 kg/m   Physical Exam Constitutional:      Comments: Alert and nontoxic.  Appears very uncomfortable.  Holding an emesis bag and occasionally spitting into it.  No respiratory distress.  HENT:     Head: Normocephalic and atraumatic.     Right Ear: Tympanic membrane normal.     Left Ear: Tympanic membrane normal.     Mouth/Throat:     Comments: Tongue has white plaque. B/L tonsilar fossae, dense white, pale yellow eshar. Airway widely patent Eyes:     Pupils: Pupils are equal, round, and reactive to light.  Neck:     Comments: No meningismus or trismus Cardiovascular:     Pulses: Normal pulses.     Heart sounds: Normal heart sounds.  Pulmonary:     Effort: Pulmonary effort is normal.     Breath sounds: Normal breath sounds.  Abdominal:     Palpations: Abdomen is soft.     Tenderness: There is abdominal tenderness.     Comments: Epigastric tenderness without garding  Musculoskeletal:        General: No swelling or tenderness. Normal range of motion.     Cervical back: Neck supple.  Skin:    General: Skin is warm and dry.  Neurological:     General: No focal deficit present.     Mental Status: She is oriented to person, place, and time.     Coordination: Coordination normal.     ED Results / Procedures / Treatments   Labs (all labs ordered are listed, but only abnormal results are displayed) Labs Reviewed  COMPREHENSIVE METABOLIC PANEL - Abnormal; Notable for the following components:      Result Value   Potassium 3.4 (*)    Creatinine, Ser 1.10 (*)     Total Protein 8.4 (*)    All other components within normal limits  URINALYSIS, ROUTINE W REFLEX MICROSCOPIC - Abnormal; Notable for the following components:   Color, Urine BROWN (*)    APPearance TURBID (*)    Hgb urine dipstick LARGE (*)    Ketones, ur 80 (*)    Protein, ur 30 (*)  All other components within normal limits  URINALYSIS, MICROSCOPIC (REFLEX) - Abnormal; Notable for the following components:   Bacteria, UA FEW (*)    All other components within normal limits  LIPASE, BLOOD  LACTIC ACID, PLASMA  CBC WITH DIFFERENTIAL/PLATELET  HCG, SERUM, QUALITATIVE  LACTIC ACID, PLASMA    EKG None  Radiology DG Chest 2 View  Result Date: 04/26/2020 CLINICAL DATA:  20 year old female with acute cough and pain. EXAM: CHEST - 2 VIEW COMPARISON:  04/26/2019 and prior radiographs FINDINGS: The cardiomediastinal silhouette is unremarkable. There is no evidence of focal airspace disease, pulmonary edema, suspicious pulmonary nodule/mass, pleural effusion, or pneumothorax. No acute bony abnormalities are identified. IMPRESSION: No active cardiopulmonary disease. Electronically Signed   By: Harmon Pier M.D.   On: 04/26/2020 16:58    Procedures Procedures (including critical care time)  Medications Ordered in ED Medications  lactated ringers bolus 2,000 mL (0 mLs Intravenous Stopped 04/26/20 1909)  famotidine (PEPCID) IVPB 20 mg premix (0 mg Intravenous Stopped 04/26/20 1805)  ondansetron (ZOFRAN) injection 4 mg (4 mg Intravenous Given 04/26/20 1630)  acetaminophen (TYLENOL) 160 MG/5ML solution 500 mg (500 mg Oral Given 04/26/20 1804)    ED Course  I have reviewed the triage vital signs and the nursing notes.  Pertinent labs & imaging results that were available during my care of the patient were reviewed by me and considered in my medical decision making (see chart for details).  Clinical Course as of 04/26/20 1927  Thu Apr 26, 2020  1744 Patient reports feeling more comfortable.   She is ready to try some oral intake. [MP]    Clinical Course User Index [MP] Arby Barrette, MD   MDM Rules/Calculators/A&P                          Patient continues to have postoperative pain after tonsillectomy a week ago.  Throat exam shows intact eschars without bleeding or visible clot.  There is erythema but the throat is clear and widely patent.  Patient does not have fever or leukocytosis.  She is experiencing ear pain.  Bilateral TMs are normal.  Other complicating issue is that patient has become nauseated and has burning stomach pain.  She has not eaten for almost 5 days.  At this time, she is given Pepcid, Zofran and rehydrated.  Patient appears significantly improved.  Plan will be for twice daily Pepcid, liquid acetaminophen, Zofran as needed, Hycodan if needed for additional pain control.  Patient is encouraged to continue taking fluids orally and start taking Ensure supplements.  Return precautions reviewed.  Patient recommended ASAP follow-up with ENT. Final Clinical Impression(s) / ED Diagnoses Final diagnoses:  Post-operative pain  Dehydration  Acute gastritis without hemorrhage, unspecified gastritis type    Rx / DC Orders ED Discharge Orders         Ordered    famotidine (PEPCID) 40 MG/5ML suspension  2 times daily        04/26/20 1924    acetaminophen (TYLENOL) 160 MG/5ML elixir  Every 6 hours PRN        04/26/20 1924    ondansetron (ZOFRAN ODT) 4 MG disintegrating tablet  Every 4 hours PRN        04/26/20 1924    HYDROcodone-homatropine (HYCODAN) 5-1.5 MG/5ML syrup  Every 4 hours PRN        04/26/20 1924           Arby Barrette, MD 04/26/20 1929

## 2020-04-26 NOTE — ED Notes (Signed)
ED Provider at bedside. 

## 2020-04-26 NOTE — Discharge Instructions (Signed)
1.  You have become dehydrated.  Try to take Pepcid twice daily as prescribed to protect your stomach from excess acid secretion and irritation from your medications.  Use Zofran dissolvable tablet underneath the tongue to control nausea.  You may take liquid Tylenol (acetaminophen) every 4 hours for pain control.  You have been prescribed Hycodan syrup.  This contains hydrocodone for pain but no acetaminophen.  Thus, you may continue to take acetaminophen while you are using the Hycodan syrup. 2.  Stay hydrated with water, popsicles and you may drink Ensure or eat Ensure pudding for extra nutrition while you are recuperating. 3.  Call your ear nose throat specialist and schedule a recheck within the next 1 to 2 days. 4.  Return to the emergency department if you are feeling worse or symptoms are not improving.

## 2020-04-26 NOTE — ED Triage Notes (Signed)
She had her tonsils removed last week. Sore throat and ear pain. She also has new onset of abdominal pain.

## 2020-05-02 ENCOUNTER — Other Ambulatory Visit: Payer: BC Managed Care – PPO

## 2020-05-02 DIAGNOSIS — Z20822 Contact with and (suspected) exposure to covid-19: Secondary | ICD-10-CM

## 2020-05-03 DIAGNOSIS — Z20822 Contact with and (suspected) exposure to covid-19: Secondary | ICD-10-CM | POA: Diagnosis not present

## 2020-05-04 LAB — NOVEL CORONAVIRUS, NAA: SARS-CoV-2, NAA: NOT DETECTED

## 2020-05-04 LAB — SARS-COV-2, NAA 2 DAY TAT

## 2020-05-28 DIAGNOSIS — N760 Acute vaginitis: Secondary | ICD-10-CM | POA: Diagnosis not present

## 2020-05-28 DIAGNOSIS — Z113 Encounter for screening for infections with a predominantly sexual mode of transmission: Secondary | ICD-10-CM | POA: Diagnosis not present

## 2020-05-28 DIAGNOSIS — L738 Other specified follicular disorders: Secondary | ICD-10-CM | POA: Diagnosis not present

## 2020-07-20 DIAGNOSIS — R0989 Other specified symptoms and signs involving the circulatory and respiratory systems: Secondary | ICD-10-CM | POA: Diagnosis not present

## 2020-07-20 DIAGNOSIS — K219 Gastro-esophageal reflux disease without esophagitis: Secondary | ICD-10-CM | POA: Diagnosis not present

## 2020-08-28 ENCOUNTER — Other Ambulatory Visit: Payer: BC Managed Care – PPO

## 2020-08-28 DIAGNOSIS — Z111 Encounter for screening for respiratory tuberculosis: Secondary | ICD-10-CM | POA: Diagnosis not present

## 2020-08-31 DIAGNOSIS — Z111 Encounter for screening for respiratory tuberculosis: Secondary | ICD-10-CM | POA: Diagnosis not present

## 2020-08-31 DIAGNOSIS — R7611 Nonspecific reaction to tuberculin skin test without active tuberculosis: Secondary | ICD-10-CM | POA: Diagnosis not present

## 2020-09-20 ENCOUNTER — Telehealth: Payer: Self-pay | Admitting: Family

## 2020-09-20 NOTE — Telephone Encounter (Signed)
Patient is requesting a call back frm melissa to see if she would write her a letter of recommendation for [employer] school

## 2020-09-21 NOTE — Telephone Encounter (Signed)
Patient notified of note below. 

## 2020-09-25 IMAGING — DX DG CHEST 2V
2 series · 2 of 2 positions shown · non-contrast
Comparison: 02/05/2018

CLINICAL DATA: Chest pain and shortness of breath

EXAM:
CHEST - 2 VIEW

[chest pa]
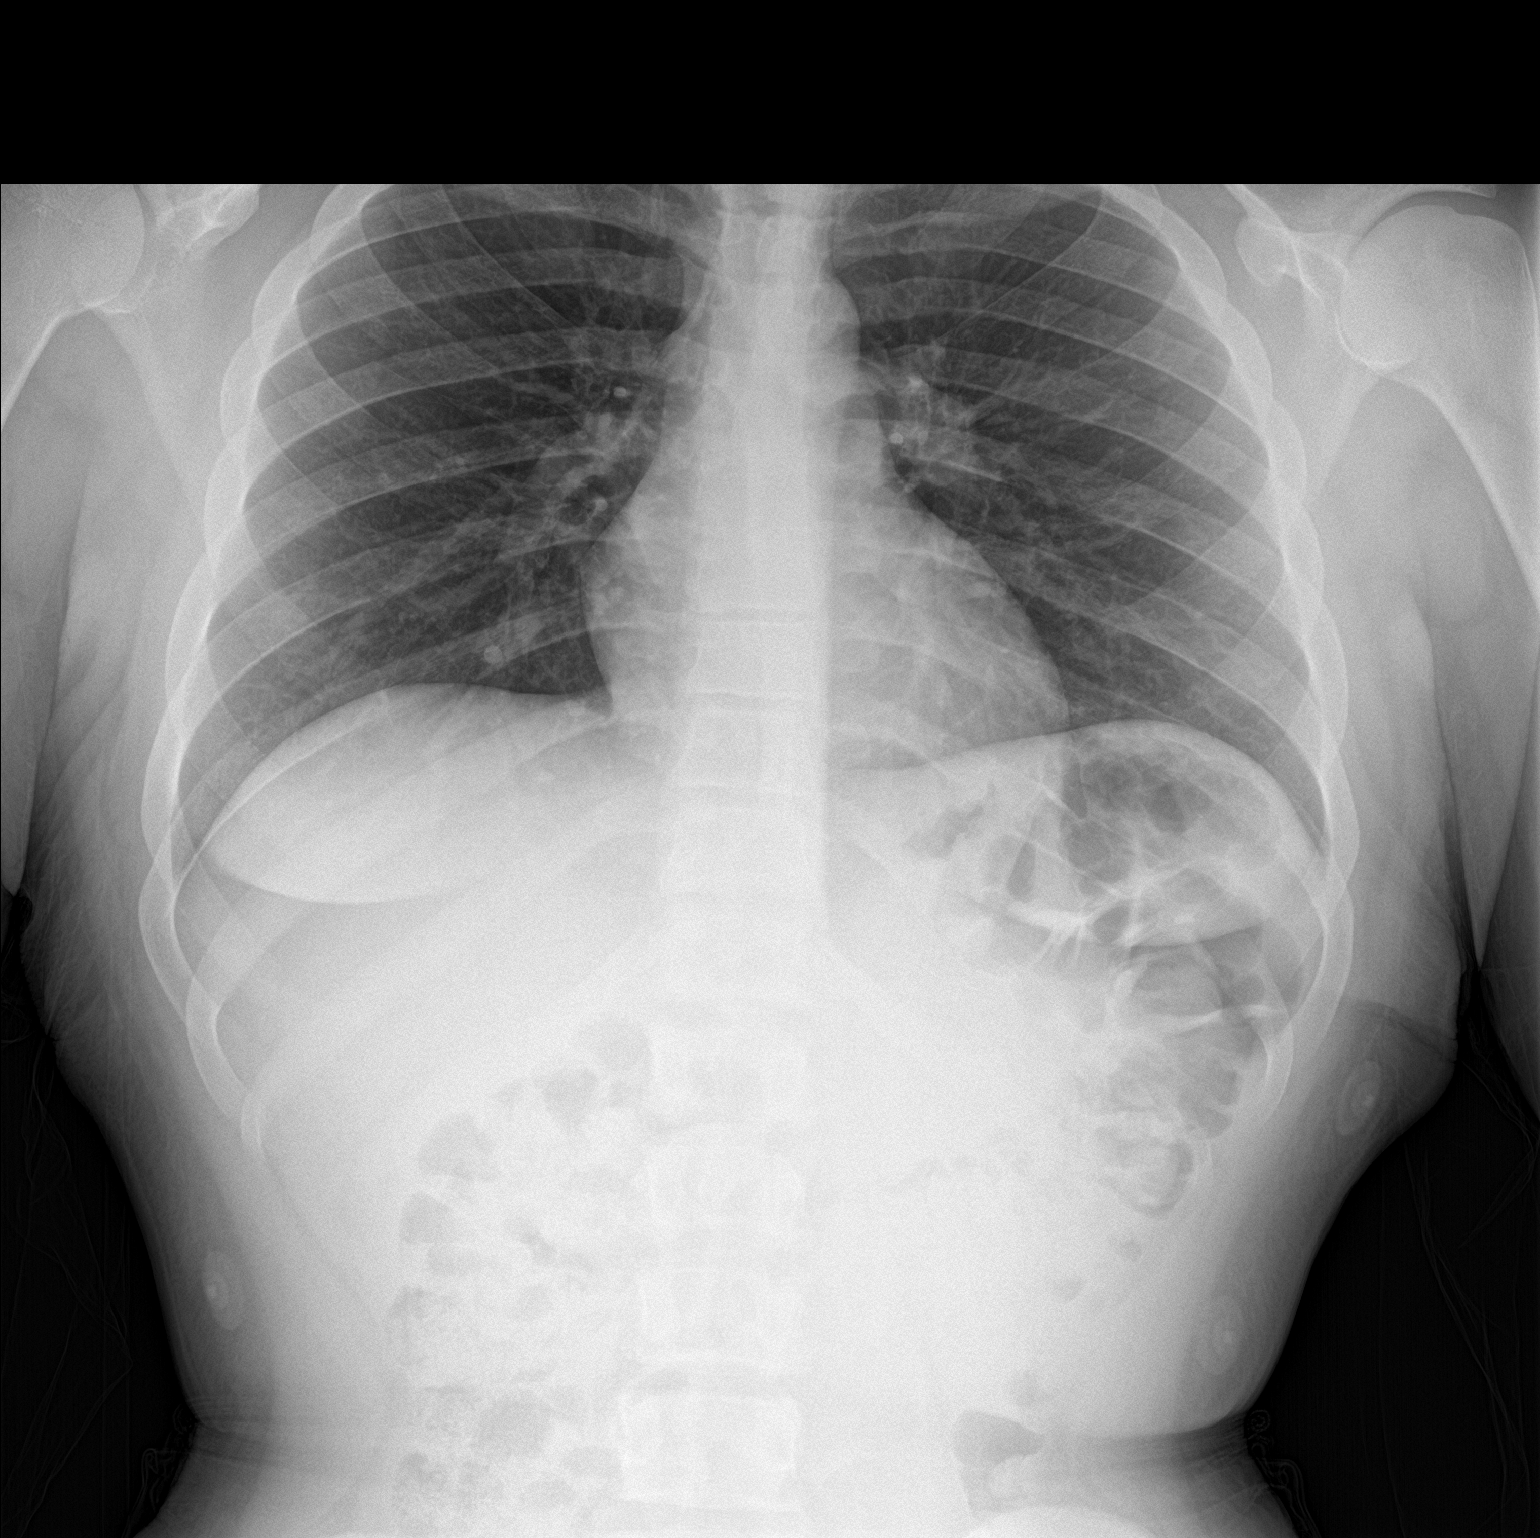

[chest lat]
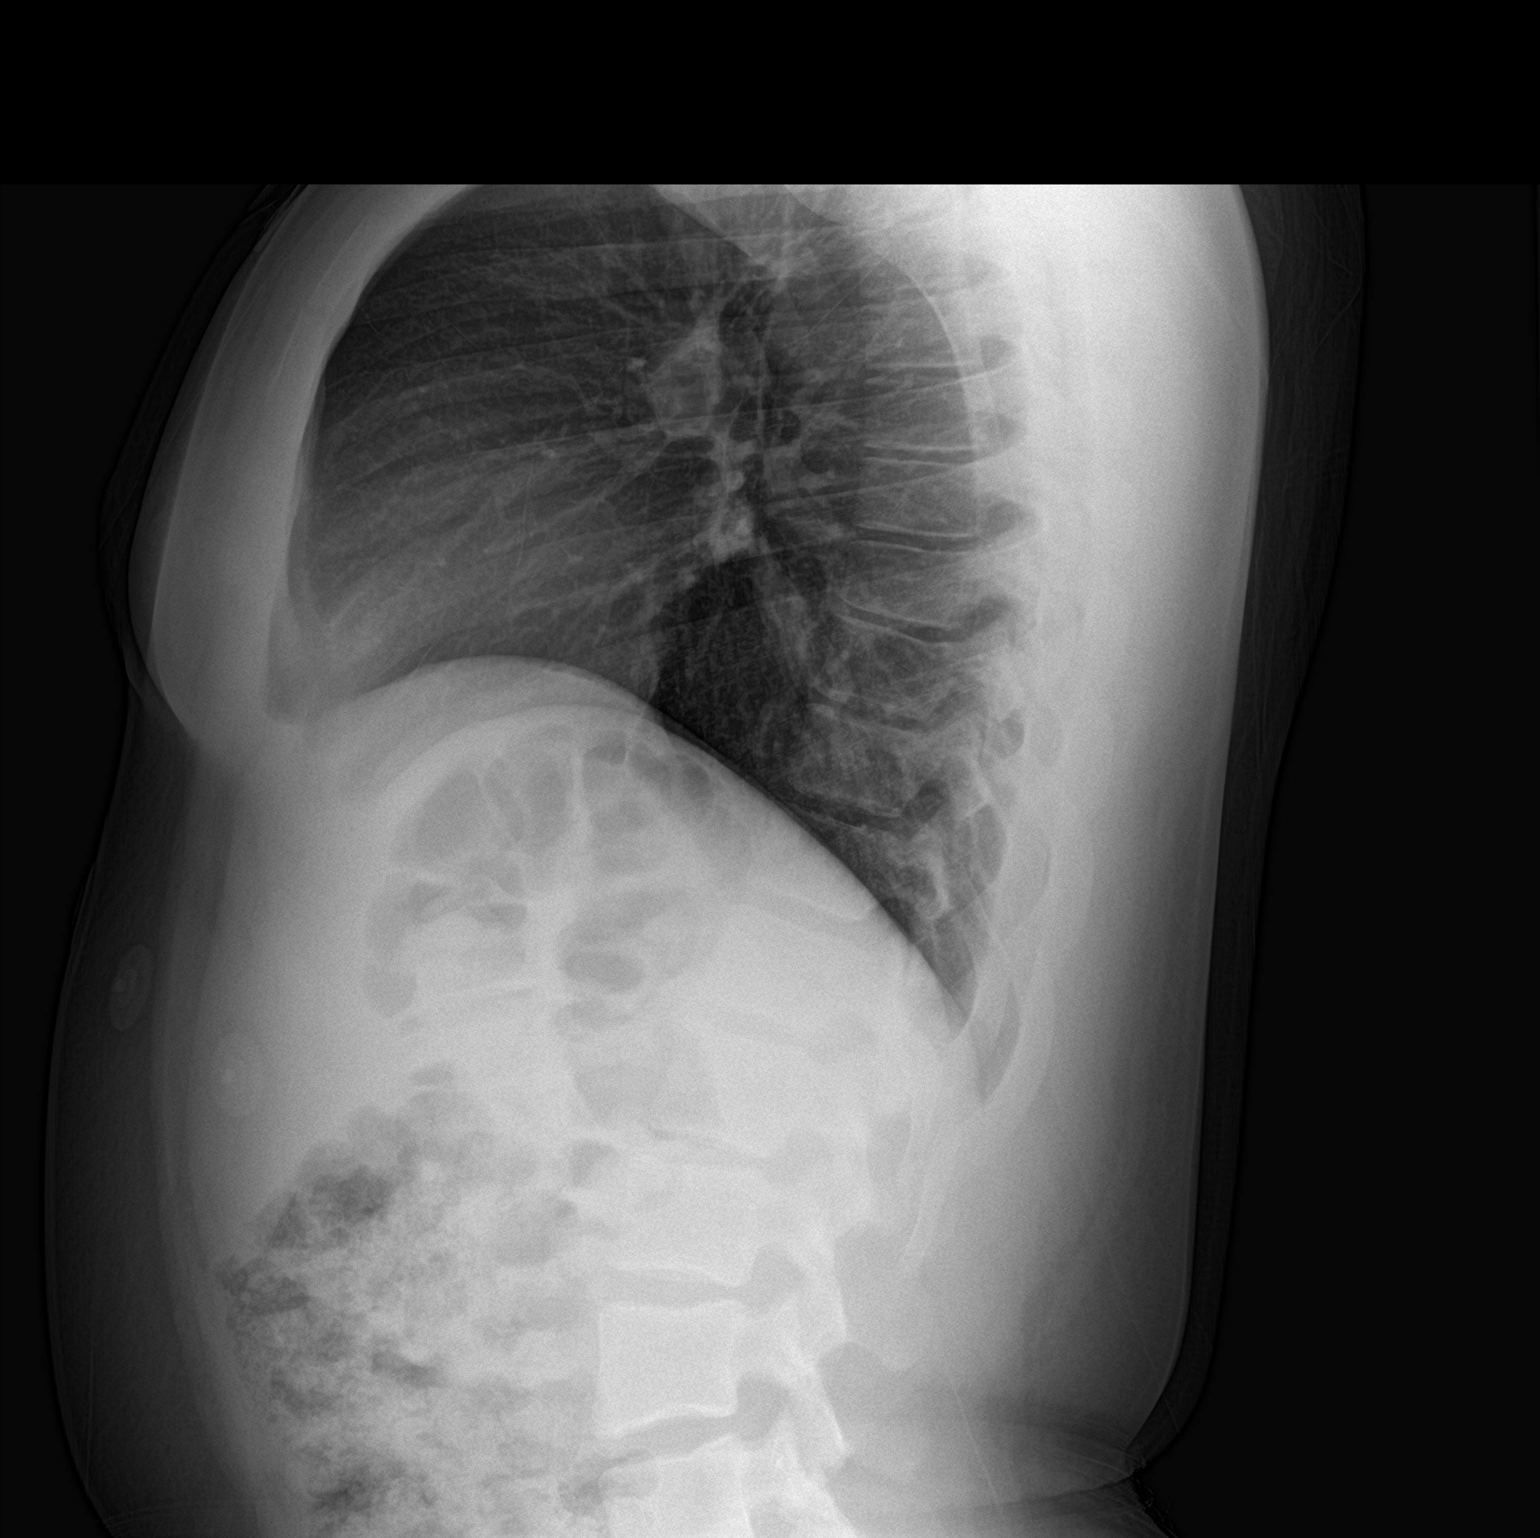

[2 of 2 positions shown; findings below may reference images not displayed]

FINDINGS: The heart size and mediastinal contours are within normal limits.
Both lungs are clear. The visualized skeletal structures are
unremarkable.
IMPRESSION: No active cardiopulmonary disease.

## 2020-10-05 ENCOUNTER — Ambulatory Visit: Payer: BC Managed Care – PPO | Admitting: Family

## 2020-10-05 ENCOUNTER — Other Ambulatory Visit: Payer: Self-pay

## 2020-10-05 ENCOUNTER — Encounter: Payer: Self-pay | Admitting: Family

## 2020-10-05 VITALS — BP 108/70 | HR 80 | Temp 98.7°F | Resp 16 | Wt 199.0 lb

## 2020-10-05 DIAGNOSIS — R0989 Other specified symptoms and signs involving the circulatory and respiratory systems: Secondary | ICD-10-CM

## 2020-10-05 DIAGNOSIS — M79671 Pain in right foot: Secondary | ICD-10-CM | POA: Diagnosis not present

## 2020-10-05 DIAGNOSIS — Z113 Encounter for screening for infections with a predominantly sexual mode of transmission: Secondary | ICD-10-CM

## 2020-10-05 DIAGNOSIS — Z30011 Encounter for initial prescription of contraceptive pills: Secondary | ICD-10-CM | POA: Diagnosis not present

## 2020-10-05 DIAGNOSIS — R198 Other specified symptoms and signs involving the digestive system and abdomen: Secondary | ICD-10-CM | POA: Insufficient documentation

## 2020-10-05 DIAGNOSIS — Z0000 Encounter for general adult medical examination without abnormal findings: Secondary | ICD-10-CM | POA: Insufficient documentation

## 2020-10-05 MED ORDER — OMEPRAZOLE 40 MG PO CPDR
40.0000 mg | DELAYED_RELEASE_CAPSULE | Freq: Every day | ORAL | 1 refills | Status: DC
Start: 2020-10-05 — End: 2020-10-19

## 2020-10-05 MED ORDER — NORETHIN ACE-ETH ESTRAD-FE 1-20 MG-MCG PO TABS
1.0000 | ORAL_TABLET | Freq: Every day | ORAL | 3 refills | Status: AC
Start: 2020-10-05 — End: ?

## 2020-10-05 NOTE — Assessment & Plan Note (Signed)
Likely musculoskeletal pain secondary to running.  She reports pain is improving. Will monitor for now.

## 2020-10-05 NOTE — Assessment & Plan Note (Signed)
Likely related to gerd. Discussed GERD diet, will give trial of omeprazole 40mg  once daily. Follow up in 6 weeks.

## 2020-10-05 NOTE — Patient Instructions (Addendum)
Please stop by the lab on the way out.  Start omeprazole 40mg  once daily.

## 2020-10-05 NOTE — Assessment & Plan Note (Signed)
Check urine ancillary testing for gonorrhea/chlamydia.

## 2020-10-05 NOTE — Assessment & Plan Note (Signed)
She stopped her OCP. Check urine HCG, if negative, resume loestrin 1/20 tonight. She is also interested in Nexplanon insertion and I will ask the front desk to make her an appointment with my colleague who inserts these.

## 2020-10-05 NOTE — Progress Notes (Signed)
Subjective:   By signing my name below, I, Shehryar Baig, attest that this documentation has been prepared under the direction and in the presence of Sandford Craze NP. 10/05/2020    Patient ID: Elizabeth Harmon, female    DOB: May 20, 2000, 20 y.o.   MRN: [id number]  No chief complaint on file.   HPI Patient is in today for a office visit. She complains of right foot pain for the past week. It was red and swollen but has decreased since she first noticed it. She recently started running more at the gym and thinks this caused some of her pain. She also complains of trouble clearing her throat and burning in her throat. She thinks she has acid reflux. She is requesting a refill on 1-20 mg Loestrin daily PO. She last had her period last week. She denies having any vaginal discharge. She continues to have ingrown hairs along her bikini line but none of these are inflammed or red.     No past medical history on file.  Past Surgical History:  Procedure Laterality Date   EYE SURGERY     OTHER SURGICAL HISTORY Bilateral    tear duct    TONSILLECTOMY Bilateral 04/19/2020   Procedure: TONSILLECTOMY;  Surgeon: Laren Boom, DO;  Location: [facility];  Service: ENT;  Laterality: Bilateral;    Family History  Problem Relation Age of Onset   Hyperthyroidism Mother    Diabetes Father    Hyperlipidemia Father    Hypertension Father    Heart attack Maternal Uncle    Heart attack Paternal Uncle    Cancer Paternal Uncle        lung   Cancer Paternal Aunt        lung   Cancer Maternal Grandfather        prostate   Arthritis Maternal Grandfather    Sudden death Neg Hx     Social History   Socioeconomic History   Marital status: Single    Spouse name: Not on file   Number of children: Not on file   Years of education: Not on file   Highest education level: Not on file  Occupational History   Not on file  Tobacco Use   Smoking status: Never   Smokeless  tobacco: Never  Vaping Use   Vaping Use: Never used  Substance and Sexual Activity   Alcohol use: No   Drug use: No   Sexual activity: Not on file  Other Topics Concern   Not on file  Social History Narrative      Dad lives at home   She is a Consulting civil engineer at [employer], she is studying biochemistry   Enjoys television, basketball, reading   Does well in school   Social Determinants of Health   Financial Resource Strain: Not on file  Food Insecurity: Not on file  Transportation Needs: Not on file  Physical Activity: Not on file  Stress: Not on file  Social Connections: Not on file  Intimate Partner Violence: Not on file    Outpatient Medications Prior to Visit  Medication Sig Dispense Refill   acetaminophen (TYLENOL) 160 MG/5ML elixir Take 15.6 mLs (500 mg total) by mouth every 6 (six) hours as needed for fever. 237 mL 1   benzoyl peroxide-erythromycin (BENZAMYCIN) gel Apply topically 2 (two) times daily. 23.3 g 3   docusate sodium (COLACE) 100 MG capsule Take 1 capsule (100 mg total) by mouth daily as needed. 30 capsule 2  EPINEPHrine 0.3 mg/0.3 mL IJ SOAJ injection Inject 0.3 mLs (0.3 mg total) into the muscle as needed for anaphylaxis. 2 each 0   famotidine (PEPCID) 40 MG/5ML suspension Take 2.5 mLs (20 mg total) by mouth 2 (two) times daily. 50 mL 0   HYDROcodone-homatropine (HYCODAN) 5-1.5 MG/5ML syrup Take 5 mLs by mouth every 4 (four) hours as needed for cough. 120 mL 0   naproxen (NAPROSYN) 500 MG tablet Take 500 mg by mouth 2 (two) times daily with a meal.     norethindrone-ethinyl estradiol (LOESTRIN FE) 1-20 MG-MCG tablet Take 1 tablet by mouth daily.     ondansetron (ZOFRAN ODT) 4 MG disintegrating tablet Take 1 tablet (4 mg total) by mouth every 4 (four) hours as needed for nausea or vomiting. 20 tablet 0   predniSONE (DELTASONE) 20 MG tablet Take 20 mg by mouth 2 (two) times daily with a meal.     No facility-administered medications prior to visit.    No Known  Allergies  Review of Systems  Gastrointestinal:        (+)burning sensation in throat  Musculoskeletal:  Positive for myalgias (right foot).      Objective:    Physical Exam Constitutional:      General: She is not in acute distress.    Appearance: Normal appearance. She is not ill-appearing.  HENT:     Head: Normocephalic and atraumatic.     Right Ear: External ear normal.     Left Ear: External ear normal.  Eyes:     Extraocular Movements: Extraocular movements intact.     Pupils: Pupils are equal, round, and reactive to light.  Cardiovascular:     Rate and Rhythm: Normal rate and regular rhythm.     Pulses: Normal pulses.     Heart sounds: Normal heart sounds. No murmur heard.   No gallop.  Pulmonary:     Effort: Pulmonary effort is normal. No respiratory distress.     Breath sounds: Normal breath sounds. No wheezing, rhonchi or rales.  Musculoskeletal:     Comments: Right foot is without swelling, redness or tenderness to palpation  Skin:    General: Skin is warm and dry.  Neurological:     Mental Status: She is alert and oriented to person, place, and time.  Psychiatric:        Behavior: Behavior normal.    There were no vitals taken for this visit. Wt Readings from Last 3 Encounters:  04/26/20 183 lb 6.4 oz (83.2 kg) (95 %, Z= 1.68)*  04/19/20 199 lb 8.3 oz (90.5 kg) (97 %, Z= 1.94)*  02/17/20 197 lb (89.4 kg) (97 %, Z= 1.91)*   * Growth percentiles are based on CDC (Girls, 2-20 Years) data.       Assessment & Plan:   Problem List Items Addressed This Visit   None    No orders of the defined types were placed in this encounter.   I, Sandford Craze NP, personally preformed the services described in this documentation.  All medical record entries made by the scribe were at my direction and in my presence.  I have reviewed the chart and discharge instructions (if applicable) and agree that the record reflects my personal performance and is accurate  and complete. 10/05/2020   I,Shehryar Baig,acting as a Neurosurgeon for Lemont Fillers, NP.,have documented all relevant documentation on the behalf of Lemont Fillers, NP,as directed by  Lemont Fillers, NP while in the presence of Kelen Laura S  Peggyann Juba, NP.   [employer]ar [employer]

## 2020-10-06 LAB — PREGNANCY, URINE: Preg Test, Ur: NEGATIVE

## 2020-10-18 ENCOUNTER — Encounter: Payer: Self-pay | Admitting: Family

## 2020-10-18 DIAGNOSIS — R0989 Other specified symptoms and signs involving the circulatory and respiratory systems: Secondary | ICD-10-CM

## 2020-10-18 DIAGNOSIS — R198 Other specified symptoms and signs involving the digestive system and abdomen: Secondary | ICD-10-CM

## 2020-10-19 MED ORDER — PANTOPRAZOLE SODIUM 40 MG PO TBEC: 40.0000 mg | DELAYED_RELEASE_TABLET | Freq: Two times a day (BID) | ORAL | 0 refills | Status: DC

## 2020-10-19 NOTE — Addendum Note (Signed)
Addended by: Sandford Craze on: 10/19/2020 01:42 PM   Modules accepted: Orders

## 2020-10-19 NOTE — Addendum Note (Signed)
Addended by: Sandford Craze on: 10/19/2020 12:55 PM   Modules accepted: Orders

## 2020-10-31 ENCOUNTER — Ambulatory Visit: Payer: BC Managed Care – PPO | Admitting: Family Medicine

## 2020-11-21 ENCOUNTER — Other Ambulatory Visit: Payer: Self-pay

## 2020-11-21 ENCOUNTER — Ambulatory Visit: Payer: BC Managed Care – PPO | Admitting: Family

## 2020-11-21 ENCOUNTER — Encounter: Payer: Self-pay | Admitting: Family

## 2020-11-21 VITALS — BP 116/60 | HR 79 | Temp 98.1°F | Resp 16 | Wt 209.0 lb

## 2020-11-21 DIAGNOSIS — F439 Reaction to severe stress, unspecified: Secondary | ICD-10-CM | POA: Diagnosis not present

## 2020-11-21 DIAGNOSIS — M79671 Pain in right foot: Secondary | ICD-10-CM | POA: Diagnosis not present

## 2020-11-21 DIAGNOSIS — R198 Other specified symptoms and signs involving the digestive system and abdomen: Secondary | ICD-10-CM | POA: Diagnosis not present

## 2020-11-21 DIAGNOSIS — R0989 Other specified symptoms and signs involving the circulatory and respiratory systems: Secondary | ICD-10-CM

## 2020-11-21 MED ORDER — FLUTICASONE PROPIONATE 50 MCG/ACT NA SUSP: 2.0000 | Freq: Every day | NASAL | 6 refills | Status: AC

## 2020-11-21 MED ORDER — PANTOPRAZOLE SODIUM 40 MG PO TBEC: 40.0000 mg | DELAYED_RELEASE_TABLET | Freq: Two times a day (BID) | ORAL | 3 refills | Status: DC

## 2020-11-21 MED ORDER — LORATADINE 10 MG PO TABS: 10.0000 mg | ORAL_TABLET | Freq: Every day | ORAL | 11 refills | Status: DC

## 2020-11-21 NOTE — Assessment & Plan Note (Signed)
Resolved. Monitor.  

## 2020-11-21 NOTE — Progress Notes (Addendum)
Subjective:   By signing my name below, I, Shehryar Baig, attest that this documentation has been prepared under the direction and in the presence of Sandford Craze NP. 11/21/2020    Patient ID: Elizabeth Harmon, female    DOB: [birth year], 20 y.o.   MRN: [id number]  Chief Complaint  Patient presents with   Foot Pain    Patient reports right foot pain is better    HPI Patient is in today for a office visit.  Anxiety- She reports having a lot of stress in her daily life. Right foot pain- She reports her foot pain has improved since last visit. She also notes she is running more often at her gym. Reflux- She reports her reflux has not improved. She continues taking 40 mg Protonix 2x daily PO and reports no new issues while taking it. She occasionally has difficulty swallowing. She also notes having post nasal drip. She uses Flonase to manage her symptoms and finds mild relief. She has an upcoming appointment with a ENT specialist to further evaluate her symptoms.    Health Maintenance Due  Topic Date Due   COVID-19 Vaccine (1) Never done   HIV Screening  Never done   Hepatitis C Screening  Never done   INFLUENZA VACCINE  11/19/2020    No past medical history on file.  Past Surgical History:  Procedure Laterality Date   EYE SURGERY     OTHER SURGICAL HISTORY Bilateral    tear duct    TONSILLECTOMY Bilateral 04/19/2020   Procedure: TONSILLECTOMY;  Surgeon: Laren Boom, DO;  Location: [facility];  Service: ENT;  Laterality: Bilateral;    Family History  Problem Relation Age of Onset   Hyperthyroidism Mother    Diabetes Father    Hyperlipidemia Father    Hypertension Father    Heart attack Maternal Uncle    Heart attack Paternal Uncle    Cancer Paternal Uncle        lung   Cancer Paternal Aunt        lung   Cancer Maternal Grandfather        prostate   Arthritis Maternal Grandfather    Sudden death Neg Hx     Social History   Socioeconomic  History   Marital status: Single    Spouse name: Not on file   Number of children: Not on file   Years of education: Not on file   Highest education level: Not on file  Occupational History   Not on file  Tobacco Use   Smoking status: Never   Smokeless tobacco: Never  Vaping Use   Vaping Use: Never used  Substance and Sexual Activity   Alcohol use: No   Drug use: No   Sexual activity: Not on file  Other Topics Concern   Not on file  Social History Narrative      Dad lives at home   She is a Consulting civil engineer at [employer], she is studying biochemistry   Enjoys television, basketball, reading   Does well in school   Social Determinants of Health   Financial Resource Strain: Not on file  Food Insecurity: Not on file  Transportation Needs: Not on file  Physical Activity: Not on file  Stress: Not on file  Social Connections: Not on file  Intimate Partner Violence: Not on file    Outpatient Medications Prior to Visit  Medication Sig Dispense Refill   norethindrone-ethinyl estradiol-FE (LOESTRIN FE) 1-20 MG-MCG tablet Take 1 tablet by  mouth daily. 84 tablet 3   pantoprazole (PROTONIX) 40 MG tablet Take 1 tablet (40 mg total) by mouth 2 (two) times daily. 60 tablet 0   No facility-administered medications prior to visit.    No Known Allergies  Review of Systems  Psychiatric/Behavioral:  The patient is nervous/anxious.       Objective:    Physical Exam Constitutional:      General: She is not in acute distress.    Appearance: Normal appearance. She is not ill-appearing.  HENT:     Head: Normocephalic and atraumatic.     Right Ear: Tympanic membrane, ear canal and external ear normal.     Left Ear: Tympanic membrane, ear canal and external ear normal.  Eyes:     Extraocular Movements: Extraocular movements intact.     Pupils: Pupils are equal, round, and reactive to light.  Cardiovascular:     Rate and Rhythm: Normal rate and regular rhythm.     Heart sounds: Normal  heart sounds. No murmur heard.   No gallop.  Pulmonary:     Effort: Pulmonary effort is normal. No respiratory distress.     Breath sounds: Normal breath sounds. No wheezing or rales.  Skin:    General: Skin is warm and dry.  Neurological:     Mental Status: She is alert and oriented to person, place, and time.  Psychiatric:        Mood and Affect: Affect is tearful.        Behavior: Behavior normal.    BP 116/60 (BP Location: Left Arm, Patient Position: Sitting, Cuff Size: Small)   Pulse 79   Temp 98.1 F (36.7 C) (Oral)   Resp 16   Wt 209 lb (94.8 kg)   SpO2 (!) 85%   BMI 30.86 kg/m  Wt Readings from Last 3 Encounters:  11/21/20 209 lb (94.8 kg) (98 %, Z= 2.07)*  10/05/20 199 lb (90.3 kg) (97 %, Z= 1.93)*  04/26/20 183 lb 6.4 oz (83.2 kg) (95 %, Z= 1.68)*   * Growth percentiles are based on CDC (Girls, 2-20 Years) data.       Assessment & Plan:   Problem List Items Addressed This Visit       Unprioritized   Stress    Pt was tearful at our visit today.  She states she is "just having a lot of stress with everything." She did not wish to elaborate, but I advised her to let us know if there is anything we can do to help her. Pt verbalized understanding.        RESOLVED: Right foot pain    Resolved. Monitor.        Globus sensation    No significant improvement despite ongoing use of omeprazole, claritin and flonase. Encouraged pt to keep her upcoming appointment with ENT.        Other Visit Diagnoses     Foot pain, right    -  Primary     Improved. Continue same.    Meds ordered this encounter  Medications   pantoprazole (PROTONIX) 40 MG tablet    Sig: Take 1 tablet (40 mg total) by mouth 2 (two) times daily.    Dispense:  60 tablet    Refill:  3    Order Specific Question:   Supervising Provider    Answer:   Danise Edge A [[id number]]     I, Sandford Craze NP, personally preformed the services described in this documentation.  All medical  record entries made by the scribe were at my direction and in my presence.  I have reviewed the chart and discharge instructions (if applicable) and agree that the record reflects my personal performance and is accurate and complete. 11/21/2020   I,Shehryar Baig,acting as a Neurosurgeon for Lemont Fillers, NP.,have documented all relevant documentation on the behalf of Lemont Fillers, NP,as directed by  Lemont Fillers, NP while in the presence of Lemont Fillers, NP.   Lemont Fillers, NP

## 2020-11-21 NOTE — Assessment & Plan Note (Addendum)
No significant improvement despite ongoing use of protonix, claritin and flonase. Encouraged pt to keep her upcoming appointment with ENT.

## 2020-11-21 NOTE — Patient Instructions (Signed)
Please continue omeprazole and keep your upcoming appointment with ENT.

## 2020-11-21 NOTE — Assessment & Plan Note (Signed)
Pt was tearful at our visit today.  She states she is "just having a lot of stress with everything." She did not wish to elaborate, but I advised her to let us know if there is anything we can do to help her. Pt verbalized understanding.

## 2020-11-27 ENCOUNTER — Ambulatory Visit (INDEPENDENT_AMBULATORY_CARE_PROVIDER_SITE_OTHER): Payer: BC Managed Care – PPO | Admitting: Otolaryngology

## 2020-11-27 ENCOUNTER — Other Ambulatory Visit: Payer: Self-pay

## 2020-11-27 DIAGNOSIS — R0989 Other specified symptoms and signs involving the circulatory and respiratory systems: Secondary | ICD-10-CM

## 2020-11-27 DIAGNOSIS — R198 Other specified symptoms and signs involving the digestive system and abdomen: Secondary | ICD-10-CM

## 2020-11-27 NOTE — Progress Notes (Signed)
HPI: Elizabeth Harmon is a 20 y.o. female who presents is referred by her PCP for evaluation of chronic globus type symptoms that she has had for over a year.  She had previous tonsillectomy performed this past December by [facility] because of similar complaints.  However she still complains of sensation of food caught in her throat.  She occasionally will cough out some debris.  She is having no symptoms today in the office. Over the past month and a half she has been on Protonix 40 mg twice daily. When she gets the debris caught in her throat she points to the level of around the hyoid bone on both sides..  No past medical history on file. Past Surgical History:  Procedure Laterality Date   EYE SURGERY     OTHER SURGICAL HISTORY Bilateral    tear duct    TONSILLECTOMY Bilateral 04/19/2020   Procedure: TONSILLECTOMY;  Surgeon: Laren Boom, DO;  Location: [facility];  Service: ENT;  Laterality: Bilateral;   Social History   Socioeconomic History   Marital status: Single    Spouse name: Not on file   Number of children: Not on file   Years of education: Not on file   Highest education level: Not on file  Occupational History   Not on file  Tobacco Use   Smoking status: Never   Smokeless tobacco: Never  Vaping Use   Vaping Use: Never used  Substance and Sexual Activity   Alcohol use: No   Drug use: No   Sexual activity: Not on file  Other Topics Concern   Not on file  Social History Narrative      Dad lives at home   She is a Consulting civil engineer at [employer], she is studying biochemistry   Enjoys television, basketball, reading   Does well in school   Social Determinants of Health   Financial Resource Strain: Not on file  Food Insecurity: Not on file  Transportation Needs: Not on file  Physical Activity: Not on file  Stress: Not on file  Social Connections: Not on file   Family History  Problem Relation Age of Onset   Hyperthyroidism Mother     Diabetes Father    Hyperlipidemia Father    Hypertension Father    Heart attack Maternal Uncle    Heart attack Paternal Uncle    Cancer Paternal Uncle        lung   Cancer Paternal Aunt        lung   Cancer Maternal Grandfather        prostate   Arthritis Maternal Grandfather    Sudden death Neg Hx    No Known Allergies Prior to Admission medications   Medication Sig Start Date End Date Taking? Authorizing Provider  fluticasone (FLONASE) 50 MCG/ACT nasal spray Place 2 sprays into both nostrils daily. 11/21/20   Sandford Craze, NP  loratadine (CLARITIN) 10 MG tablet Take 1 tablet (10 mg total) by mouth daily. 11/21/20   Sandford Craze, NP  norethindrone-ethinyl estradiol-FE (LOESTRIN FE) 1-20 MG-MCG tablet Take 1 tablet by mouth daily. 10/05/20   Sandford Craze, NP  pantoprazole (PROTONIX) 40 MG tablet Take 1 tablet (40 mg total) by mouth 2 (two) times daily. 11/21/20   Sandford Craze, NP     Positive ROS: Otherwise negative  All other systems have been reviewed and were otherwise negative with the exception of those mentioned in the HPI and as above.  Physical Exam: Constitutional: Alert, well-appearing, no  acute distress Ears: External ears without lesions or tenderness. Ear canals are clear bilaterally with intact, clear TMs.  Nasal: External nose without lesions. Septum with minimal deformity and mild rhinitis.  Both middle meatus regions are clear with no signs of infection.  No polyps noted..  Oral: Lips and gums without lesions. Tongue and palate mucosa without lesions. Posterior oropharynx clear.  She is status post tonsillectomy.  Indirect laryngoscopy revealed mildly prominent lingual tonsillar tissue.  No foreign bodies noted within the tonsillar fossa's or base of tongue area.  The vallecula is clear.  Epiglottis is normal.  Vocal cords are clear bilaterally with normal vocal mobility.  Both piriform sinuses are clear.  Minimal edema of the arytenoid mucosa and  no erythema. Neck: No palpable adenopathy or masses. Respiratory: Breathing comfortably  Skin: No facial/neck lesions or rash noted.  Procedures  Assessment: Globus type symptoms questionable etiology.  Plan: Reviewed with the patient today concerning normal examination with no foreign body or food debris or anything abnormal noted on clinical exam in the office today. Could possibly be related to GE reflux disease although her symptoms appear little bit higher in her throat than on typical globus reflux which is more in the region of the larynx and cricoid cartilage. Recommended continuation with the Protonix but cut it down to just 1 dose a day before dinner. She will follow-up if she notices any increase debris noted in her throat on a as needed basis but reassured her of normal upper airway exam in the office today.   Narda Bonds, MD   CC:

## 2020-12-18 DIAGNOSIS — Z20828 Contact with and (suspected) exposure to other viral communicable diseases: Secondary | ICD-10-CM | POA: Diagnosis not present

## 2020-12-21 DIAGNOSIS — N3000 Acute cystitis without hematuria: Secondary | ICD-10-CM | POA: Diagnosis not present

## 2020-12-21 DIAGNOSIS — Z0000 Encounter for general adult medical examination without abnormal findings: Secondary | ICD-10-CM | POA: Diagnosis not present

## 2020-12-21 DIAGNOSIS — J0110 Acute frontal sinusitis, unspecified: Secondary | ICD-10-CM | POA: Diagnosis not present

## 2021-02-18 ENCOUNTER — Encounter: Payer: BC Managed Care – PPO | Admitting: Family

## 2021-02-22 ENCOUNTER — Encounter: Payer: BC Managed Care – PPO | Admitting: Family

## 2021-02-22 NOTE — Progress Notes (Incomplete)
Subjective:   By signing my name below, I, Lyric Barr-McArthur, attest that this documentation has been prepared under the direction and in the presence of Sandford Craze, NP, 02/22/2021   Patient ID: Elizabeth Harmon, female    DOB: 21-Dec-2000, 20 y.o.   MRN: [id number]  No chief complaint on file.   HPI Patient is in today for a comprehensive physical exam.  denies any fever, unexpected weight change, adenopathy, rash, hearing loss, ear pain, rhinorrhea, visual disturbances, eye pain, chest pain, leg swelling, cough, nausea, vomitting, diarrhea, blood in stool, dysuria, frequency, myalgias, arthralgias, headaches, depression or anxiety.   Immunizations: Diet: Exercise: Dental:  Vision: Alcohol:  Drugs: Tobacco: SHx: FMHx:  Health Maintenance Due  Topic Date Due   COVID-19 Vaccine (1) Never done   HIV Screening  Never done   Hepatitis C Screening  Never done   INFLUENZA VACCINE  11/19/2020    No past medical history on file.  Past Surgical History:  Procedure Laterality Date   EYE SURGERY     OTHER SURGICAL HISTORY Bilateral    tear duct    TONSILLECTOMY Bilateral 04/19/2020   Procedure: TONSILLECTOMY;  Surgeon: Laren Boom, DO;  Location: [facility];  Service: ENT;  Laterality: Bilateral;    Family History  Problem Relation Age of Onset   Hyperthyroidism Mother    Diabetes Father    Hyperlipidemia Father    Hypertension Father    Heart attack Maternal Uncle    Heart attack Paternal Uncle    Cancer Paternal Uncle        lung   Cancer Paternal Aunt        lung   Cancer Maternal Grandfather        prostate   Arthritis Maternal Grandfather    Sudden death Neg Hx     Social History   Socioeconomic History   Marital status: Single    Spouse name: Not on file   Number of children: Not on file   Years of education: Not on file   Highest education level: Not on file  Occupational History   Not on file  Tobacco Use   Smoking  status: Never   Smokeless tobacco: Never  Vaping Use   Vaping Use: Never used  Substance and Sexual Activity   Alcohol use: No   Drug use: No   Sexual activity: Not on file  Other Topics Concern   Not on file  Social History Narrative      Dad lives at home   She is a Consulting civil engineer at [employer], she is studying biochemistry   Enjoys television, basketball, reading   Does well in school   Social Determinants of Health   Financial Resource Strain: Not on file  Food Insecurity: Not on file  Transportation Needs: Not on file  Physical Activity: Not on file  Stress: Not on file  Social Connections: Not on file  Intimate Partner Violence: Not on file    Outpatient Medications Prior to Visit  Medication Sig Dispense Refill   fluticasone (FLONASE) 50 MCG/ACT nasal spray Place 2 sprays into both nostrils daily. 16 g 6   loratadine (CLARITIN) 10 MG tablet Take 1 tablet (10 mg total) by mouth daily. 30 tablet 11   norethindrone-ethinyl estradiol-FE (LOESTRIN FE) 1-20 MG-MCG tablet Take 1 tablet by mouth daily. 84 tablet 3   pantoprazole (PROTONIX) 40 MG tablet Take 1 tablet (40 mg total) by mouth 2 (two) times daily. 60 tablet 3  No facility-administered medications prior to visit.    No Known Allergies  Review of Systems  Constitutional:  Negative for fever.       (-) unexpected weight changes (-) adenopathy   HENT:  Negative for ear pain and hearing loss.        (-) rhinorrhea  Eyes:  Negative for pain.       (-) visual disturbances   Respiratory:  Negative for cough.   Cardiovascular:  Negative for chest pain and leg swelling.  Gastrointestinal:  Negative for blood in stool, diarrhea, nausea and vomiting.  Genitourinary:  Negative for dysuria and frequency.  Musculoskeletal:  Negative for joint pain and myalgias.  Skin:  Negative for rash.  Neurological:  Negative for headaches.  Psychiatric/Behavioral:  Negative for depression. The patient is not nervous/anxious.        Objective:    Physical Exam Constitutional:      General: She is not in acute distress.    Appearance: Normal appearance. She is not ill-appearing.  HENT:     Head: Normocephalic and atraumatic.     Right Ear: Tympanic membrane, ear canal and external ear normal.     Left Ear: Tympanic membrane, ear canal and external ear normal.  Eyes:     Extraocular Movements: Extraocular movements intact.     Pupils: Pupils are equal, round, and reactive to light.     Comments: (-) nystagmus  Cardiovascular:     Rate and Rhythm: Normal rate and regular rhythm.     Heart sounds: Normal heart sounds. No murmur heard.   No gallop.  Pulmonary:     Effort: Pulmonary effort is normal. No respiratory distress.     Breath sounds: Normal breath sounds. No wheezing or rales.  Musculoskeletal:     Comments: (+) 5/5 upper and lower extremity strength  Lymphadenopathy:     Cervical: No cervical adenopathy.  Skin:    General: Skin is warm and dry.  Neurological:     Mental Status: She is alert and oriented to person, place, and time.  Psychiatric:        Behavior: Behavior normal.        Judgment: Judgment normal.    There were no vitals taken for this visit. Wt Readings from Last 3 Encounters:  11/21/20 209 lb (94.8 kg) (98 %, Z= 2.07)*  10/05/20 199 lb (90.3 kg) (97 %, Z= 1.93)*  04/26/20 183 lb 6.4 oz (83.2 kg) (95 %, Z= 1.68)*   * Growth percentiles are based on CDC (Girls, 2-20 Years) data.       Assessment & Plan:   Problem List Items Addressed This Visit   None  No orders of the defined types were placed in this encounter.  I, Sandford Craze, NP, personally preformed the services described in this documentation.  All medical record entries made by the scribe were at my direction and in my presence.  I have reviewed the chart and discharge instructions (if applicable) and agree that the record reflects my personal performance and is accurate and complete. 02/22/2021  I,Lyric  Barr-McArthur,acting as a Neurosurgeon for Lemont Fillers, NP.,have documented all relevant documentation on the behalf of Lemont Fillers, NP,as directed by  Lemont Fillers, NP while in the presence of Lemont Fillers, NP.  Lyric Barr-McArthur

## 2021-03-24 DIAGNOSIS — M542 Cervicalgia: Secondary | ICD-10-CM | POA: Diagnosis not present

## 2021-03-24 DIAGNOSIS — M79602 Pain in left arm: Secondary | ICD-10-CM | POA: Diagnosis not present

## 2021-03-24 DIAGNOSIS — M25552 Pain in left hip: Secondary | ICD-10-CM | POA: Diagnosis not present

## 2021-03-24 DIAGNOSIS — X58XXXA Exposure to other specified factors, initial encounter: Secondary | ICD-10-CM | POA: Diagnosis not present

## 2021-03-24 DIAGNOSIS — M79622 Pain in left upper arm: Secondary | ICD-10-CM | POA: Diagnosis not present

## 2021-04-02 ENCOUNTER — Encounter (INDEPENDENT_AMBULATORY_CARE_PROVIDER_SITE_OTHER): Payer: BC Managed Care – PPO | Admitting: Family

## 2021-04-02 DIAGNOSIS — N760 Acute vaginitis: Secondary | ICD-10-CM | POA: Diagnosis not present

## 2021-04-02 DIAGNOSIS — B9689 Other specified bacterial agents as the cause of diseases classified elsewhere: Secondary | ICD-10-CM

## 2021-04-03 DIAGNOSIS — B9689 Other specified bacterial agents as the cause of diseases classified elsewhere: Secondary | ICD-10-CM | POA: Insufficient documentation

## 2021-04-03 DIAGNOSIS — N760 Acute vaginitis: Secondary | ICD-10-CM | POA: Insufficient documentation

## 2021-04-03 MED ORDER — CLINDAMYCIN PHOSPHATE 2 % VA CREA: 1.0000 | TOPICAL_CREAM | Freq: Every day | VAGINAL | 0 refills | Status: DC

## 2021-04-03 NOTE — Telephone Encounter (Signed)
Please see the MyChart message reply(ies) for my assessment and plan.  The patient gave consent for this Medical Advice Message and is aware that it may result in a bill to their insurance company as well as the possibility that this may result in a co-payment or deductible. They are an established patient, but are not seeking medical advice exclusively about a problem treated during an in person or video visit in the last 7 days. I did not recommend an in person or video visit within 7 days of my reply.  Cumulative time spent within 7 days through [employer] 6 minutes.  Lemont Fillers, NP

## 2021-04-03 NOTE — Addendum Note (Signed)
Addended by: Sandford Craze on: 04/03/2021 10:34 AM   Modules accepted: Orders

## 2021-04-09 MED ORDER — METRONIDAZOLE 500 MG PO TABS: 500.0000 mg | ORAL_TABLET | Freq: Two times a day (BID) | ORAL | 0 refills | Status: AC

## 2021-04-09 NOTE — Addendum Note (Signed)
Addended by: Sandford Craze on: 04/09/2021 07:02 AM   Modules accepted: Orders

## 2021-04-26 ENCOUNTER — Encounter: Payer: Self-pay | Admitting: Family

## 2021-05-01 ENCOUNTER — Ambulatory Visit (INDEPENDENT_AMBULATORY_CARE_PROVIDER_SITE_OTHER): Payer: BC Managed Care – PPO | Admitting: Family

## 2021-05-01 VITALS — BP 104/59 | HR 87 | Temp 98.8°F | Resp 16 | Ht 69.0 in | Wt 216.0 lb

## 2021-05-01 DIAGNOSIS — J309 Allergic rhinitis, unspecified: Secondary | ICD-10-CM | POA: Diagnosis not present

## 2021-05-01 DIAGNOSIS — M25512 Pain in left shoulder: Secondary | ICD-10-CM

## 2021-05-01 DIAGNOSIS — M25511 Pain in right shoulder: Secondary | ICD-10-CM | POA: Insufficient documentation

## 2021-05-01 DIAGNOSIS — M778 Other enthesopathies, not elsewhere classified: Secondary | ICD-10-CM | POA: Insufficient documentation

## 2021-05-01 LAB — SEDIMENTATION RATE: Sed Rate: 10 mm/hr (ref 0–20)

## 2021-05-01 MED ORDER — MONTELUKAST SODIUM 10 MG PO TABS: 10.0000 mg | ORAL_TABLET | Freq: Every day | ORAL | 3 refills | Status: DC

## 2021-05-01 NOTE — Assessment & Plan Note (Signed)
Uncontrolled. No sign of bacterial sinusitis.  Pt is advised as follows:   Add singulair 10mg  once daily for nasal congestion. Continue claritin and flonase. If drainage persists, you can try changing claritin to claritin -D.  Discussed referral to allergist if symptoms do not improve.

## 2021-05-01 NOTE — Patient Instructions (Signed)
Please complete lab work prior to leaving. Add singulair 10mg  once daily for nasal congestion. Continue claritin and flonase. If drainage persists, you can try changing claritin to claritin -D.

## 2021-05-01 NOTE — Progress Notes (Signed)
Subjective:   By signing my name below, I, Shehryar Baig, attest that this documentation has been prepared under the direction and in the presence of Sandford CrazeMelissa O'Sullivan NP. 05/01/2021    Patient ID: Elizabeth Harmon, female    DOB: 05/26/2000, 20 y.o.   MRN: [id number]  Chief Complaint  Patient presents with   Nasal Congestion    Complains of nasal congestion with post nasal drip   Ear Fullness    Complains of right ear feeling full    Ear Fullness   Patient is in today for a office visit.  Congestion- She continues having nasal congestion, throat congestion, and post nasal drip. She continues taking Flonase and Claritin and found her symptoms improved but developed a fullness sensation in her ear. She denies having any fevers. She feels these symptoms are more related to her allergies than sickness.  Shoulder pain- She continues having pain in both shoulders and reports they have worsened since her last visit. Her pain worsens when laying down in bed or when the weather is cold. She was given medication from an urgent care provider to manage her symptoms but found no change.    Health Maintenance Due  Topic Date Due   COVID-19 Vaccine (1) Never done   HIV Screening  Never done   Hepatitis C Screening  Never done   INFLUENZA VACCINE  11/19/2020    No past medical history on file.  Past Surgical History:  Procedure Laterality Date   EYE SURGERY     OTHER SURGICAL HISTORY Bilateral    tear duct    TONSILLECTOMY Bilateral 04/19/2020   Procedure: TONSILLECTOMY;  Surgeon: Laren BoomSkotnicki, Meghan A, DO;  Location: [facility];  Service: ENT;  Laterality: Bilateral;    Family History  Problem Relation Age of Onset   Hyperthyroidism Mother    Diabetes Father    Hyperlipidemia Father    Hypertension Father    Heart attack Maternal Uncle    Heart attack Paternal Uncle    Cancer Paternal Uncle        lung   Cancer Paternal Aunt        lung   Cancer Maternal Grandfather         prostate   Arthritis Maternal Grandfather    Sudden death Neg Hx     Social History   Socioeconomic History   Marital status: Single    Spouse name: Not on file   Number of children: Not on file   Years of education: Not on file   Highest education level: Not on file  Occupational History   Not on file  Tobacco Use   Smoking status: Never   Smokeless tobacco: Never  Vaping Use   Vaping Use: Never used  Substance and Sexual Activity   Alcohol use: No   Drug use: No   Sexual activity: Not on file  Other Topics Concern   Not on file  Social History Narrative      Dad lives at home   She is a Consulting civil engineerstudent at [employer], she is studying biochemistry   Enjoys television, basketball, reading   Does well in school   Social Determinants of Health   Financial Resource Strain: Not on file  Food Insecurity: Not on file  Transportation Needs: Not on file  Physical Activity: Not on file  Stress: Not on file  Social Connections: Not on file  Intimate Partner Violence: Not on file    Outpatient Medications Prior to Visit  Medication Sig Dispense Refill   fluticasone (FLONASE) 50 MCG/ACT nasal spray Place 2 sprays into both nostrils daily. 16 g 6   loratadine (CLARITIN) 10 MG tablet Take 1 tablet (10 mg total) by mouth daily. 30 tablet 11   pantoprazole (PROTONIX) 40 MG tablet Take 1 tablet (40 mg total) by mouth 2 (two) times daily. 60 tablet 3   norethindrone-ethinyl estradiol-FE (LOESTRIN FE) 1-20 MG-MCG tablet Take 1 tablet by mouth daily. (Patient not taking: Reported on 05/01/2021) 84 tablet 3   No facility-administered medications prior to visit.    No Known Allergies  Review of Systems  HENT:  Positive for congestion.        (+)post-nasal drip  Musculoskeletal:  Positive for myalgias (bilateral shoulder pain).      Objective:    Physical Exam Constitutional:      General: She is not in acute distress.    Appearance: Normal appearance. She is not  ill-appearing.  HENT:     Head: Normocephalic and atraumatic.     Right Ear: External ear normal.     Left Ear: External ear normal.  Eyes:     Extraocular Movements: Extraocular movements intact.     Pupils: Pupils are equal, round, and reactive to light.  Cardiovascular:     Rate and Rhythm: Normal rate and regular rhythm.     Heart sounds: Normal heart sounds. No murmur heard.   No gallop.  Pulmonary:     Effort: Pulmonary effort is normal. No respiratory distress.     Breath sounds: Normal breath sounds. No wheezing or rales.  Lymphadenopathy:     Cervical: No cervical adenopathy.  Skin:    General: Skin is warm and dry.  Neurological:     Mental Status: She is alert and oriented to person, place, and time.  Psychiatric:        Behavior: Behavior normal.    BP (!) 104/59 (BP Location: Right Arm, Patient Position: Sitting, Cuff Size: Large)    Pulse 87    Temp 98.8 F (37.1 C) (Oral)    Resp 16    Ht 5\' 9"  (1.753 m)    Wt 216 lb (98 kg)    SpO2 100%    BMI 31.90 kg/m  Wt Readings from Last 3 Encounters:  05/01/21 216 lb (98 kg)  11/21/20 209 lb (94.8 kg) (98 %, Z= 2.07)*  10/05/20 199 lb (90.3 kg) (97 %, Z= 1.93)*   * Growth percentiles are based on CDC (Girls, 2-20 Years) data.       Assessment & Plan:   Problem List Items Addressed This Visit       Unprioritized   Bilateral shoulder pain - Primary    Not improved with NSAIDS.  No known injury. Will obtain labs as ordered to evaluate for underlying autoimmune etiology and will refer to sports medicine.       Relevant Orders   ANA   Rheumatoid Factor   Sedimentation rate   Ambulatory referral to Sports Medicine   Allergic rhinitis    Uncontrolled. No sign of bacterial sinusitis.  Pt is advised as follows:   Add singulair 10mg  once daily for nasal congestion. Continue claritin and flonase. If drainage persists, you can try changing claritin to claritin -D.  Discussed referral to allergist if symptoms do  not improve.         Meds ordered this encounter  Medications   montelukast (SINGULAIR) 10 MG tablet    Sig: Take 1 tablet (  10 mg total) by mouth at bedtime.    Dispense:  30 tablet    Refill:  3    Order Specific Question:   Supervising Provider    Answer:   Danise Edge A [[id number]]    I, Sandford Craze NP, personally preformed the services described in this documentation.  All medical record entries made by the scribe were at my direction and in my presence.  I have reviewed the chart and discharge instructions (if applicable) and agree that the record reflects my personal performance and is accurate and complete. 05/01/2021   I,Shehryar Baig,acting as a Neurosurgeon for Lemont Fillers, NP.,have documented all relevant documentation on the behalf of Lemont Fillers, NP,as directed by  Lemont Fillers, NP while in the presence of Lemont Fillers, NP.   Lemont Fillers, NP

## 2021-05-01 NOTE — Assessment & Plan Note (Signed)
Not improved with NSAIDS.  No known injury. Will obtain labs as ordered to evaluate for underlying autoimmune etiology and will refer to sports medicine.

## 2021-05-02 LAB — RHEUMATOID FACTOR: Rheumatoid fact SerPl-aCnc: <14 IU/mL (ref <14)

## 2021-05-02 LAB — ANA: Anti Nuclear Antibody (ANA): NEGATIVE

## 2021-05-20 DIAGNOSIS — N898 Other specified noninflammatory disorders of vagina: Secondary | ICD-10-CM | POA: Diagnosis not present

## 2021-05-20 DIAGNOSIS — Z3009 Encounter for other general counseling and advice on contraception: Secondary | ICD-10-CM | POA: Diagnosis not present

## 2021-06-14 ENCOUNTER — Ambulatory Visit: Payer: BC Managed Care – PPO | Admitting: Family

## 2021-06-14 ENCOUNTER — Ambulatory Visit: Payer: BC Managed Care – PPO | Admitting: Family Medicine

## 2021-06-14 DIAGNOSIS — J309 Allergic rhinitis, unspecified: Secondary | ICD-10-CM | POA: Diagnosis not present

## 2021-06-14 DIAGNOSIS — R0989 Other specified symptoms and signs involving the circulatory and respiratory systems: Secondary | ICD-10-CM | POA: Diagnosis not present

## 2021-06-14 DIAGNOSIS — M25512 Pain in left shoulder: Secondary | ICD-10-CM | POA: Diagnosis not present

## 2021-06-14 DIAGNOSIS — M25511 Pain in right shoulder: Secondary | ICD-10-CM | POA: Diagnosis not present

## 2021-06-14 MED ORDER — PANTOPRAZOLE SODIUM 40 MG PO TBEC: 40.0000 mg | DELAYED_RELEASE_TABLET | Freq: Every day | ORAL | 3 refills | Status: DC

## 2021-06-14 NOTE — Assessment & Plan Note (Signed)
Uncontrolled. Will restart pantoprazole once daily.

## 2021-06-14 NOTE — Assessment & Plan Note (Signed)
Some improvement with singulair. Continue singulair along with claritin and flonase.

## 2021-06-14 NOTE — Patient Instructions (Signed)
Please restart pantoprazole once daily for reflux. Continue singulair.  Please reschedule your appointment with sports medicine.

## 2021-06-14 NOTE — Progress Notes (Signed)
Subjective:     Patient ID: Elizabeth Harmon, female    DOB: 2000/07/25, 21 y.o.   MRN: [id number]  Chief Complaint  Patient presents with   Allergic Rhinitis     Feeling better   Shoulder Pain    Complains of still having bilateral shoulder pain    Shoulder Pain   Patient is in today for follow up.  Last visit she reported bilateral shoulder pain. ANA/ESR/RA testing all WNL. We referred her to sports medicine. Today's appointment ended up getting cancelled. She reports shoulder pain is unchanged.   Allergic rhinitis- maintained on singulair, claritin and flonase. Symptoms "are still there but a lot better."   Globus sensation- Reports that protonix helped her globus sensation.  She hasn't taken in some time though.    Health Maintenance Due  Topic Date Due   COVID-19 Vaccine (1) Never done   HIV Screening  Never done   Hepatitis C Screening  Never done   INFLUENZA VACCINE  11/19/2020    No past medical history on file.  Past Surgical History:  Procedure Laterality Date   EYE SURGERY     OTHER SURGICAL HISTORY Bilateral    tear duct    TONSILLECTOMY Bilateral 04/19/2020   Procedure: TONSILLECTOMY;  Surgeon: Jason Coop, DO;  Location: [facility];  Service: ENT;  Laterality: Bilateral;    Family History  Problem Relation Age of Onset   Hyperthyroidism Mother    Diabetes Father    Hyperlipidemia Father    Hypertension Father    Heart attack Maternal Uncle    Heart attack Paternal Uncle    Cancer Paternal Uncle        lung   Cancer Paternal Aunt        lung   Cancer Maternal Grandfather        prostate   Arthritis Maternal Grandfather    Sudden death Neg Hx     Social History   Socioeconomic History   Marital status: Single    Spouse name: Not on file   Number of children: Not on file   Years of education: Not on file   Highest education level: Not on file  Occupational History   Not on file  Tobacco Use   Smoking status: Never    Smokeless tobacco: Never  Vaping Use   Vaping Use: Never used  Substance and Sexual Activity   Alcohol use: No   Drug use: No   Sexual activity: Not on file  Other Topics Concern   Not on file  Social History Narrative      Dad lives at home   She is a Ship broker at [employer], she is studying biochemistry   Enjoys television, basketball, reading   Does well in school   Social Determinants of Health   Financial Resource Strain: Not on file  Food Insecurity: Not on file  Transportation Needs: Not on file  Physical Activity: Not on file  Stress: Not on file  Social Connections: Not on file  Intimate Partner Violence: Not on file    Outpatient Medications Prior to Visit  Medication Sig Dispense Refill   fluticasone (FLONASE) 50 MCG/ACT nasal spray Place 2 sprays into both nostrils daily. 16 g 6   loratadine (CLARITIN) 10 MG tablet Take 1 tablet (10 mg total) by mouth daily. 30 tablet 11   montelukast (SINGULAIR) 10 MG tablet Take 1 tablet (10 mg total) by mouth at bedtime. 30 tablet 3   norethindrone-ethinyl estradiol-FE (  LOESTRIN FE) 1-20 MG-MCG tablet Take 1 tablet by mouth daily. 84 tablet 3   pantoprazole (PROTONIX) 40 MG tablet Take 1 tablet (40 mg total) by mouth 2 (two) times daily. 60 tablet 3   No facility-administered medications prior to visit.    No Known Allergies  ROS    See HPI Objective:    Physical Exam Constitutional:      General: She is not in acute distress.    Appearance: Normal appearance. She is well-developed.  HENT:     Head: Normocephalic and atraumatic.     Right Ear: External ear normal.     Left Ear: External ear normal.  Eyes:     General: No scleral icterus. Neck:     Thyroid: No thyromegaly.  Cardiovascular:     Rate and Rhythm: Normal rate and regular rhythm.     Heart sounds: Normal heart sounds. No murmur heard. Pulmonary:     Effort: Pulmonary effort is normal. No respiratory distress.     Breath sounds: Normal breath  sounds. No wheezing.  Musculoskeletal:     Cervical back: Neck supple.  Skin:    General: Skin is warm and dry.  Neurological:     Mental Status: She is alert and oriented to person, place, and time.  Psychiatric:        Mood and Affect: Mood normal.        Behavior: Behavior normal.        Thought Content: Thought content normal.        Judgment: Judgment normal.    BP 110/64 (BP Location: Right Arm, Patient Position: Sitting, Cuff Size: Large)    Pulse 74    Temp 98.7 F (37.1 C) (Oral)    Resp 16    Wt 215 lb (97.5 kg)    SpO2 100%    BMI 31.75 kg/m  Wt Readings from Last 3 Encounters:  06/14/21 215 lb (97.5 kg)  05/01/21 216 lb (98 kg)  11/21/20 209 lb (94.8 kg) (98 %, Z= 2.07)*   * Growth percentiles are based on CDC (Girls, 2-20 Years) data.       Assessment & Plan:   Problem List Items Addressed This Visit       Unprioritized   Globus sensation    Uncontrolled. Will restart pantoprazole once daily.       Bilateral shoulder pain    Unchanged. Pt advised to reschedule her appointment with Sports medicine.       Allergic rhinitis    Some improvement with singulair. Continue singulair along with claritin and flonase.        I have discontinued Bevan Domagalski's pantoprazole. I am also having her start on pantoprazole. Additionally, I am having her maintain her norethindrone-ethinyl estradiol-FE, loratadine, fluticasone, and montelukast.  Meds ordered this encounter  Medications   pantoprazole (PROTONIX) 40 MG tablet    Sig: Take 1 tablet (40 mg total) by mouth daily.    Dispense:  30 tablet    Refill:  3    Order Specific Question:   Supervising Provider    Answer:   Penni Homans A [[id number]]

## 2021-06-14 NOTE — Assessment & Plan Note (Signed)
Unchanged. Pt advised to reschedule her appointment with Sports medicine.

## 2021-08-12 ENCOUNTER — Ambulatory Visit: Payer: BC Managed Care – PPO | Admitting: Medical

## 2021-08-13 DIAGNOSIS — B372 Candidiasis of skin and nail: Secondary | ICD-10-CM | POA: Diagnosis not present

## 2021-08-21 DIAGNOSIS — B353 Tinea pedis: Secondary | ICD-10-CM | POA: Diagnosis not present

## 2021-08-21 DIAGNOSIS — B351 Tinea unguium: Secondary | ICD-10-CM | POA: Diagnosis not present

## 2021-09-11 ENCOUNTER — Ambulatory Visit: Payer: BC Managed Care – PPO | Admitting: Family

## 2021-09-18 ENCOUNTER — Ambulatory Visit: Payer: BC Managed Care – PPO | Admitting: Family

## 2021-10-18 ENCOUNTER — Ambulatory Visit: Payer: BC Managed Care – PPO | Admitting: Family

## 2021-10-18 ENCOUNTER — Encounter: Payer: Self-pay | Admitting: Family

## 2021-10-18 VITALS — BP 116/58 | HR 80 | Temp 99.8°F | Resp 16 | Ht 69.0 in | Wt 214.0 lb

## 2021-10-18 DIAGNOSIS — F32A Depression, unspecified: Secondary | ICD-10-CM | POA: Insufficient documentation

## 2021-10-18 DIAGNOSIS — R0989 Other specified symptoms and signs involving the circulatory and respiratory systems: Secondary | ICD-10-CM | POA: Diagnosis not present

## 2021-10-18 DIAGNOSIS — J309 Allergic rhinitis, unspecified: Secondary | ICD-10-CM

## 2021-10-18 DIAGNOSIS — F419 Anxiety disorder, unspecified: Secondary | ICD-10-CM | POA: Insufficient documentation

## 2021-10-18 MED ORDER — SERTRALINE HCL 50 MG PO TABS: ORAL_TABLET | ORAL | 0 refills | Status: DC

## 2021-10-18 MED ORDER — MONTELUKAST SODIUM 10 MG PO TABS: 10.0000 mg | ORAL_TABLET | Freq: Every day | ORAL | 1 refills | Status: DC

## 2021-10-18 NOTE — Patient Instructions (Signed)
Start zoloft 1/2 tab once daily for 1 week, then increase to a full tab on week two.

## 2021-10-18 NOTE — Progress Notes (Signed)
Subjective:   By signing my name below, I, Cassell Clement, attest that this documentation has been prepared under the direction and in the presence of Alma Downs' Suvillivan, NP 10/18/2021.    Patient ID: Elizabeth Harmon, female    DOB: 03-04-2001, 21 y.o.   MRN: [id number]  Chief Complaint  Patient presents with   Seasonal allergies    Here for follow up, still having symptoms    Feelings of sadness    Patient complains of unexplained feelings of sadness   Weight Management Screening    Will like to discuss weight management    HPI Patient is in today for an office visit.  Mood: For the past few months she has been feeling down. This is sometimes impacting her ability to complete tasks and stay motivated. She confirms feeling moderately depressed and anxious. Allergies: Currently she is taking 50 mcg/act Flonase, 10 mg Claritin, and 10 mg Singulair. She reports these medications have not been very effective in managing her allergies. She endorses having many types of environmental allergies. Acid reflux: She still sometimes feels like there is something stuck in her throat. She takes 40 mg pantoprazole daily, but has not noticed any significant difference in her symptoms. Social: She recently graduated with a 4-year degree. Overall she states her last year of school went well. Otherwise she denies any significant life changes/stressors. 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  Health Maintenance Due  Topic Date Due   COVID-19 Vaccine (1) Never done   HIV Screening  Never done   Hepatitis C Screening  Never done    No past medical history on file.  Past Surgical History:  Procedure Laterality Date   EYE SURGERY     OTHER SURGICAL HISTORY Bilateral    tear duct    TONSILLECTOMY Bilateral 04/19/2020   Procedure: TONSILLECTOMY;  Surgeon: Laren Boom, DO;  Location:  SURGERY CENTER;  Service: ENT;  Laterality: Bilateral;    Family  History  Problem Relation Age of Onset   Hyperthyroidism Mother    Diabetes Father    Hyperlipidemia Father    Hypertension Father    Heart attack Maternal Uncle    Heart attack Paternal Uncle    Cancer Paternal Uncle        lung   Cancer Paternal Aunt        lung   Cancer Maternal Grandfather        prostate   Arthritis Maternal Grandfather    Sudden death Neg Hx     Social History   Socioeconomic History   Marital status: Single    Spouse name: Not on file   Number of children: Not on file   Years of education: Not on file   Highest education level: Not on file  Occupational History   Not on file  Tobacco Use   Smoking status: Never   Smokeless tobacco: Never  Vaping Use   Vaping Use: Never used  Substance and Sexual Activity   Alcohol use: No   Drug use: No   Sexual activity: Not on file  Other Topics Concern   Not on file  Social History Narrative   Plans to attend [employer] in [place] fall 2023   Enjoys television, basketball, reading   Does well in school      Social Determinants of Health   Financial Resource Strain: Not on file  Food Insecurity: Not on file  Transportation Needs:  Not on file  Physical Activity: Not on file  Stress: Not on file  Social Connections: Not on file  Intimate Partner Violence: Not on file    Outpatient Medications Prior to Visit  Medication Sig Dispense Refill   fluticasone (FLONASE) 50 MCG/ACT nasal spray Place 2 sprays into both nostrils daily. 16 g 6   loratadine (CLARITIN) 10 MG tablet Take 1 tablet (10 mg total) by mouth daily. 30 tablet 11   norethindrone-ethinyl estradiol-FE (LOESTRIN FE) 1-20 MG-MCG tablet Take 1 tablet by mouth daily. 84 tablet 3   montelukast (SINGULAIR) 10 MG tablet Take 1 tablet (10 mg total) by mouth at bedtime. 30 tablet 3   pantoprazole (PROTONIX) 40 MG tablet Take 1 tablet (40 mg total) by mouth daily. 30 tablet 3   No facility-administered medications prior  to visit.    No Known Allergies  ROS See HPI    Objective:    Physical Exam Constitutional:      Appearance: Normal appearance.  HENT:     Head: Normocephalic and atraumatic.     Right Ear: External ear normal.     Left Ear: External ear normal.  Eyes:     Extraocular Movements: Extraocular movements intact.     Pupils: Pupils are equal, round, and reactive to light.  Cardiovascular:     Rate and Rhythm: Normal rate and regular rhythm.     Pulses: Normal pulses.     Heart sounds: Normal heart sounds. No murmur heard.    No gallop.  Pulmonary:     Effort: Pulmonary effort is normal. No respiratory distress.     Breath sounds: Normal breath sounds. No wheezing or rales.  Skin:    General: Skin is warm and dry.  Neurological:     Mental Status: She is alert and oriented to person, place, and time.  Psychiatric:        Mood and Affect: Mood normal. Affect is tearful.        Behavior: Behavior normal.        Judgment: Judgment normal.     BP (!) 116/58 (BP Location: Right Arm, Patient Position: Sitting, Cuff Size: Large)   Pulse 80   Temp 99.8 F (37.7 C) (Oral)   Resp 16   Ht 5\' 9"  (1.753 m)   Wt 214 lb (97.1 kg)   SpO2 100%   BMI 31.60 kg/m  Wt Readings from Last 3 Encounters:  10/18/21 214 lb (97.1 kg)  06/14/21 215 lb (97.5 kg)  05/01/21 216 lb (98 kg)       Assessment & Plan:   Problem List Items Addressed This Visit       Unprioritized   Globus sensation    No improvement with Pantoprazole. D/c pantoprazole.Perhaps it is due to her chronic post nasal drip. Hopefully the allergist can help her with this.       Depression - Primary    New.  Recommended trial of zoloft 50mg  1/2 tab once daily for 1 week, then increase to a full tab once daily.       Relevant Medications   sertraline (ZOLOFT) 50 MG tablet   Allergic rhinitis    Uncontrolled. Will refer to Allergy      Relevant Medications   montelukast (SINGULAIR) 10 MG tablet   Other  Relevant Orders   Ambulatory referral to Allergy      Meds ordered this encounter  Medications   sertraline (ZOLOFT) 50 MG tablet    Sig: 1/2 tab  once daily for 1 week, then increase to a full tab once daily on week two    Dispense:  30 tablet    Refill:  0    Order Specific Question:   Supervising Provider    Answer:   Danise Edge A [[id number]]   montelukast (SINGULAIR) 10 MG tablet    Sig: Take 1 tablet (10 mg total) by mouth at bedtime.    Dispense:  90 tablet    Refill:  1    Order Specific Question:   Supervising Provider    Answer:   Danise Edge A [[id number]]    I, Lemont Fillers, NP, personally preformed the services described in this documentation.  All medical record entries made by the scribe were at my direction and in my presence.  I have reviewed the chart and discharge instructions (if applicable) and agree that the record reflects my personal performance and is accurate and complete. 10/18/2021   I,Amber Collins,acting as a scribe for Lemont Fillers, NP.,have documented all relevant documentation on the behalf of Lemont Fillers, NP,as directed by  Lemont Fillers, NP while in the presence of Lemont Fillers, NP.    Lemont Fillers, NP

## 2021-10-18 NOTE — Assessment & Plan Note (Signed)
Wt Readings from Last 3 Encounters:  10/18/21 214 lb (97.1 kg)  06/14/21 215 lb (97.5 kg)  05/01/21 216 lb (98 kg)

## 2021-10-18 NOTE — Assessment & Plan Note (Signed)
No improvement with Pantoprazole. D/c pantoprazole.Perhaps it is due to her chronic post nasal drip. Hopefully the allergist can help her with this.

## 2021-10-18 NOTE — Assessment & Plan Note (Signed)
Uncontrolled. Will refer to Allergy

## 2021-10-18 NOTE — Assessment & Plan Note (Signed)
New.  Recommended trial of zoloft 50mg  1/2 tab once daily for 1 week, then increase to a full tab once daily.

## 2021-11-15 ENCOUNTER — Ambulatory Visit: Payer: BC Managed Care – PPO | Admitting: Family

## 2021-11-15 DIAGNOSIS — F32A Depression, unspecified: Secondary | ICD-10-CM

## 2021-11-15 MED ORDER — SERTRALINE HCL 50 MG PO TABS: 50.0000 mg | ORAL_TABLET | Freq: Every day | ORAL | 0 refills | Status: DC

## 2021-11-15 NOTE — Progress Notes (Signed)
Subjective:   By signing my name below, I, Cassell Clement, attest that this documentation has been prepared under the direction and in the presence of Alma Downs' Suvillivan, NP 11/15/2021   Patient ID: Elizabeth Harmon, female    DOB: Jul 09, 2000, 20 y.o.   MRN: [id number]  Chief Complaint  Patient presents with   Depression    "Doing a little better on Zoloft"    HPI Patient is in today for an office visit.  Mood: She reports that her mood is improving while taking 50 Mg of Zoloft. She states that the frequency of her episodes of low mood is decreasing. She denies of any side effects, abnormal sleep or abnormal appetite.   Health Maintenance Due  Topic Date Due   COVID-19 Vaccine (1) Never done   HIV Screening  Never done   Hepatitis C Screening  Never done    No past medical history on file.  Past Surgical History:  Procedure Laterality Date   EYE SURGERY     OTHER SURGICAL HISTORY Bilateral    tear duct    TONSILLECTOMY Bilateral 04/19/2020   Procedure: TONSILLECTOMY;  Surgeon: Laren Boom, DO;  Location: [facility];  Service: ENT;  Laterality: Bilateral;    Family History  Problem Relation Age of Onset   Hyperthyroidism Mother    Diabetes Father    Hyperlipidemia Father    Hypertension Father    Heart attack Maternal Uncle    Heart attack Paternal Uncle    Cancer Paternal Uncle        lung   Cancer Paternal Aunt        lung   Cancer Maternal Grandfather        prostate   Arthritis Maternal Grandfather    Sudden death Neg Hx     Social History   Socioeconomic History   Marital status: Single    Spouse name: Not on file   Number of children: Not on file   Years of education: Not on file   Highest education level: Not on file  Occupational History   Not on file  Tobacco Use   Smoking status: Never   Smokeless tobacco: Never  Vaping Use   Vaping Use: Never used  Substance and Sexual Activity   Alcohol use: No   Drug use: No    Sexual activity: Not on file  Other Topics Concern   Not on file  Social History Narrative   Plans to attend [employer] in [place] fall 2023   Enjoys television, basketball, reading   Does well in school      Social Determinants of Health   Financial Resource Strain: Not on file  Food Insecurity: Not on file  Transportation Needs: Not on file  Physical Activity: Not on file  Stress: Not on file  Social Connections: Not on file  Intimate Partner Violence: Not on file    Outpatient Medications Prior to Visit  Medication Sig Dispense Refill   fluticasone (FLONASE) 50 MCG/ACT nasal spray Place 2 sprays into both nostrils daily. 16 g 6   loratadine (CLARITIN) 10 MG tablet Take 1 tablet (10 mg total) by mouth daily. 30 tablet 11   montelukast (SINGULAIR) 10 MG tablet Take 1 tablet (10 mg total) by mouth at bedtime. 90 tablet 1   norethindrone-ethinyl estradiol-FE (LOESTRIN FE) 1-20 MG-MCG tablet Take 1 tablet by mouth daily. 84 tablet 3   sertraline (ZOLOFT) 50 MG tablet 1/2 tab once daily  for 1 week, then increase to a full tab once daily on week two 30 tablet 0   No facility-administered medications prior to visit.    No Known Allergies  ROS     Objective:    Physical Exam Constitutional:      General: She is not in acute distress.    Appearance: Normal appearance. She is not ill-appearing.  HENT:     Head: Normocephalic and atraumatic.     Right Ear: External ear normal.     Left Ear: External ear normal.  Eyes:     Extraocular Movements: Extraocular movements intact.     Pupils: Pupils are equal, round, and reactive to light.  Cardiovascular:     Rate and Rhythm: Normal rate and regular rhythm.     Heart sounds: Normal heart sounds. No murmur heard.    No gallop.  Pulmonary:     Effort: Pulmonary effort is normal. No respiratory distress.     Breath sounds: Normal breath sounds. No wheezing or rales.  Skin:    General: Skin is warm and  dry.  Neurological:     Mental Status: She is alert and oriented to person, place, and time.  Psychiatric:        Mood and Affect: Mood normal.        Behavior: Behavior normal.        Judgment: Judgment normal.     BP 117/61 (BP Location: Right Arm, Patient Position: Sitting, Cuff Size: Small)   Pulse 85   Temp 99.8 F (37.7 C) (Oral)   Resp 16   Wt 204 lb (92.5 kg)   SpO2 100%   BMI 30.13 kg/m  Wt Readings from Last 3 Encounters:  11/15/21 204 lb (92.5 kg)  10/18/21 214 lb (97.1 kg)  06/14/21 215 lb (97.5 kg)       Assessment & Plan:   Problem List Items Addressed This Visit       Unprioritized   Depression    Improved on zoloft 50mg . Continue same.       Relevant Medications   sertraline (ZOLOFT) 50 MG tablet    Meds ordered this encounter  Medications   sertraline (ZOLOFT) 50 MG tablet    Sig: Take 1 tablet (50 mg total) by mouth at bedtime. 1/2 tab once daily for 1 week, then increase to a full tab once daily on week two    Dispense:  90 tablet    Refill:  0    Order Specific Question:   Supervising Provider    Answer:   A [[id number]]    I, Danise Edge, NP, personally preformed the services described in this documentation.  All medical record entries made by the scribe were at my direction and in my presence.  I have reviewed the chart and discharge instructions (if applicable) and agree that the record reflects my personal performance and is accurate and complete. 11/15/2021   I,Amber Collins,acting as a 11/17/2021 for Neurosurgeon, NP.,have documented all relevant documentation on the behalf of Lemont Fillers, NP,as directed by  Lemont Fillers, NP while in the presence of Lemont Fillers, NP.    Lemont Fillers, NP

## 2021-11-15 NOTE — Assessment & Plan Note (Signed)
Improved on zoloft 50mg . Continue same.

## 2021-12-11 DIAGNOSIS — J3089 Other allergic rhinitis: Secondary | ICD-10-CM | POA: Diagnosis not present

## 2021-12-20 ENCOUNTER — Ambulatory Visit: Payer: BC Managed Care – PPO | Admitting: Family

## 2021-12-20 ENCOUNTER — Encounter: Payer: Self-pay | Admitting: Family

## 2021-12-20 ENCOUNTER — Other Ambulatory Visit (HOSPITAL_COMMUNITY)
Admission: RE | Admit: 2021-12-20 | Discharge: 2021-12-20 | Disposition: A | Discharge status: Home / Self Care | Payer: BC Managed Care – PPO | Source: Ambulatory Visit | Attending: Family | Admitting: Family

## 2021-12-20 VITALS — BP 110/60 | HR 75 | Temp 98.7°F | Ht 69.0 in | Wt 200.0 lb

## 2021-12-20 DIAGNOSIS — Z113 Encounter for screening for infections with a predominantly sexual mode of transmission: Secondary | ICD-10-CM | POA: Diagnosis not present

## 2021-12-20 DIAGNOSIS — N898 Other specified noninflammatory disorders of vagina: Secondary | ICD-10-CM | POA: Insufficient documentation

## 2021-12-20 MED ORDER — CLINDAMYCIN PHOSPHATE 2 % VA CREA: 1.0000 | TOPICAL_CREAM | Freq: Every day | VAGINAL | 0 refills | Status: AC

## 2021-12-20 NOTE — Progress Notes (Signed)
Elizabeth Harmon is a 21 y.o. female with the following history as recorded in [facility]:  Patient Active Problem List   Diagnosis Date Noted   Depression 10/18/2021   Allergic rhinitis 05/01/2021   Bilateral shoulder pain 05/01/2021   Screen for STD (sexually transmitted disease) 10/05/2020   Encounter for initial prescription of contraceptive pills 10/05/2020   Globus sensation 10/05/2020   Irregular menses 04/28/2014   Migraines 04/28/2014   Morbid obesity (HCC) 04/28/2014    Current Outpatient Medications  Medication Sig Dispense Refill   fluticasone (FLONASE) 50 MCG/ACT nasal spray Place 2 sprays into both nostrils daily. 16 g 6   loratadine (CLARITIN) 10 MG tablet Take 1 tablet (10 mg total) by mouth daily. 30 tablet 11   montelukast (SINGULAIR) 10 MG tablet Take 1 tablet (10 mg total) by mouth at bedtime. 90 tablet 1   sertraline (ZOLOFT) 50 MG tablet Take 1 tablet (50 mg total) by mouth at bedtime. 1/2 tab once daily for 1 week, then increase to a full tab once daily on week two 90 tablet 0   clindamycin (CLEOCIN) 2 % vaginal cream Place 1 Applicatorful vaginally at bedtime for 5 days. 28.58 g 0   norethindrone-ethinyl estradiol-FE (LOESTRIN FE) 1-20 MG-MCG tablet Take 1 tablet by mouth daily. (Patient not taking: Reported on 12/20/2021) 84 tablet 3   No current facility-administered medications for this visit.    Allergies: Patient has no known allergies.  No past medical history on file.  Past Surgical History:  Procedure Laterality Date   EYE SURGERY     OTHER SURGICAL HISTORY Bilateral    tear duct    TONSILLECTOMY Bilateral 04/19/2020   Procedure: TONSILLECTOMY;  Surgeon: Laren Boom, DO;  Location: [facility];  Service: ENT;  Laterality: Bilateral;    Family History  Problem Relation Age of Onset   Hyperthyroidism Mother    Diabetes Father    Hyperlipidemia Father    Hypertension Father    Heart attack Maternal Uncle    Heart attack Paternal  Uncle    Cancer Paternal Uncle        lung   Cancer Paternal Aunt        lung   Cancer Maternal Grandfather        prostate   Arthritis Maternal Grandfather    Sudden death Neg Hx     Social History   Tobacco Use   Smoking status: Never   Smokeless tobacco: Never  Substance Use Topics   Alcohol use: No    Subjective:  Patient complaining of vaginal  discharge; concern for recent unprotected sex- requesting STD screen; + strong odor;   LMP 8/26     Objective:  Vitals:   12/20/21 0946  BP: 110/60  Pulse: 75  Temp: 98.7 F (37.1 C)  TempSrc: Oral  SpO2: 90%  Weight: 200 lb (90.7 kg)  Height: 5\' 9"  (1.753 m)    General: Well developed, well nourished, in no acute distress  Skin : Warm and dry.  Head: Normocephalic and atraumatic  Lungs: Respirations unlabored;  Neurologic: Alert and oriented; speech intact; face symmetrical; moves all extremities well; CNII-XII intact without focal deficit  Pelvic exam- whitish discharge noted;  Assessment:  1. Vaginal discharge   2. Screen for STD (sexually transmitted disease)     Plan:  Suspect BV- will treat with Clindamycin vaginal which she has tolerated in the past;  STD screen updated- follow up to be determined.  No follow-ups on file.  Orders Placed This Encounter  Procedures   HIV antibody (with reflex)   RPR    Requested Prescriptions   Signed Prescriptions Disp Refills   clindamycin (CLEOCIN) 2 % vaginal cream 28.58 g 0    Sig: Place 1 Applicatorful vaginally at bedtime for 5 days.

## 2021-12-21 LAB — RPR: RPR Ser Ql: NONREACTIVE

## 2021-12-23 LAB — HIV ANTIBODY (ROUTINE TESTING W REFLEX): HIV 1&2 Ab, 4th Generation: NONREACTIVE

## 2021-12-24 LAB — CERVICOVAGINAL ANCILLARY ONLY
Bacterial Vaginitis (gardnerella): POSITIVE — AB
Candida Glabrata: NEGATIVE
Candida Vaginitis: NEGATIVE
Chlamydia: NEGATIVE
Comment: NEGATIVE
Comment: NEGATIVE
Comment: NEGATIVE
Comment: NEGATIVE
Comment: NEGATIVE
Comment: NORMAL
Neisseria Gonorrhea: NEGATIVE
Trichomonas: NEGATIVE

## 2022-01-02 ENCOUNTER — Encounter: Payer: Self-pay | Admitting: Family

## 2022-01-08 ENCOUNTER — Other Ambulatory Visit (HOSPITAL_COMMUNITY)
Admission: RE | Admit: 2022-01-08 | Discharge: 2022-01-08 | Disposition: A | Discharge status: Home / Self Care | Payer: BC Managed Care – PPO | Source: Ambulatory Visit | Attending: Family | Admitting: Family

## 2022-01-08 ENCOUNTER — Ambulatory Visit: Payer: BC Managed Care – PPO | Admitting: Family

## 2022-01-08 VITALS — BP 120/66 | HR 78 | Resp 18 | Ht 69.0 in | Wt 199.0 lb

## 2022-01-08 DIAGNOSIS — Z113 Encounter for screening for infections with a predominantly sexual mode of transmission: Secondary | ICD-10-CM | POA: Diagnosis not present

## 2022-01-08 DIAGNOSIS — F32A Depression, unspecified: Secondary | ICD-10-CM

## 2022-01-08 DIAGNOSIS — R300 Dysuria: Secondary | ICD-10-CM

## 2022-01-08 MED ORDER — SERTRALINE HCL 50 MG PO TABS: 75.0000 mg | ORAL_TABLET | Freq: Every day | ORAL | 0 refills | Status: DC

## 2022-01-08 NOTE — Assessment & Plan Note (Signed)
Swab to be sent for GC/Clamydia, Trichomonas. Screen for HIV. Discussed importance of routine condom use.

## 2022-01-08 NOTE — Assessment & Plan Note (Signed)
Uncontrolled, worsened by recent event (abortion).  Also having significant anxiety. Recommended that she increase her sertraline to 1.5 tabs.  I also encouraged her to reach out to [facility] to schedule an appointment with a counselor.

## 2022-01-08 NOTE — Progress Notes (Signed)
Subjective:     Patient ID: Sharyl Nimrod, female    DOB: 05/30/2000, 21 y.o.   MRN: [id number]  Chief Complaint  Patient presents with   Depression    HPI Patient is in today to discuss depression. She reports difficulty sleeping.  Wakes up every 30 minutes or so.    End of last month she had an abortion.  New partner around 9/3 and again 10 days ago. Having some urinary discomfort. She is upset about the abortion. She was off of her birth control pill at that time but she is now back on it.   Health Maintenance Due  Topic Date Due   COVID-19 Vaccine (1) Never done   Hepatitis C Screening  Never done   INFLUENZA VACCINE  11/19/2021   TETANUS/TDAP  12/10/2021   PAP-Cervical Cytology Screening  12/22/2021   PAP SMEAR-Modifier  12/22/2021    No past medical history on file.  Past Surgical History:  Procedure Laterality Date   EYE SURGERY     OTHER SURGICAL HISTORY Bilateral    tear duct    TONSILLECTOMY Bilateral 04/19/2020   Procedure: TONSILLECTOMY;  Surgeon: Laren Boom, DO;  Location: [facility];  Service: ENT;  Laterality: Bilateral;    Family History  Problem Relation Age of Onset   Hyperthyroidism Mother    Diabetes Father    Hyperlipidemia Father    Hypertension Father    Heart attack Maternal Uncle    Heart attack Paternal Uncle    Cancer Paternal Uncle        lung   Cancer Paternal Aunt        lung   Cancer Maternal Grandfather        prostate   Arthritis Maternal Grandfather    Sudden death Neg Hx     Social History   Socioeconomic History   Marital status: Single    Spouse name: Not on file   Number of children: Not on file   Years of education: Not on file   Highest education level: Not on file  Occupational History   Not on file  Tobacco Use   Smoking status: Never   Smokeless tobacco: Never  Vaping Use   Vaping Use: Never used  Substance and Sexual Activity   Alcohol use: No   Drug use: No   Sexual activity:  Not on file  Other Topics Concern   Not on file  Social History Narrative   Plans to attend [employer] in [place] fall 2023   Enjoys television, basketball, reading   Does well in school      Social Determinants of Health   Financial Resource Strain: Not on file  Food Insecurity: Not on file  Transportation Needs: Not on file  Physical Activity: Not on file  Stress: Not on file  Social Connections: Not on file  Intimate Partner Violence: Not on file    Outpatient Medications Prior to Visit  Medication Sig Dispense Refill   fluticasone (FLONASE) 50 MCG/ACT nasal spray Place 2 sprays into both nostrils daily. 16 g 6   loratadine (CLARITIN) 10 MG tablet Take 1 tablet (10 mg total) by mouth daily. 30 tablet 11   montelukast (SINGULAIR) 10 MG tablet Take 1 tablet (10 mg total) by mouth at bedtime. 90 tablet 1   norethindrone-ethinyl estradiol-FE (LOESTRIN FE) 1-20 MG-MCG tablet Take 1 tablet by mouth daily. (Patient not taking: Reported on 12/20/2021) 84 tablet 3   sertraline (ZOLOFT)  50 MG tablet Take 1 tablet (50 mg total) by mouth at bedtime. 1/2 tab once daily for 1 week, then increase to a full tab once daily on week two 90 tablet 0   No facility-administered medications prior to visit.    No Known Allergies  ROS See HPI    Objective:    Physical Exam Constitutional:      Appearance: Normal appearance.  Pulmonary:     Effort: Pulmonary effort is normal.  Genitourinary:    General: Normal vulva.     Labia:        Right: No rash.        Left: No rash.      Comments: Some mild folliculitis noted over the mons pubus Skin:    General: Skin is warm and dry.  Neurological:     Mental Status: She is alert.     BP 120/66   Pulse 78   Resp 18   Ht 5\' 9"  (1.753 m)   Wt 199 lb (90.3 kg)   LMP 12/14/2021   SpO2 100%   BMI 29.39 kg/m  Wt Readings from Last 3 Encounters:  01/08/22 199 lb (90.3 kg)  12/20/21 200 lb (90.7 kg)  11/15/21 204 lb  (92.5 kg)       Assessment & Plan:   Problem List Items Addressed This Visit       Unprioritized   Screen for STD (sexually transmitted disease)    Swab to be sent for GC/Clamydia, Trichomonas. Screen for HIV. Discussed importance of routine condom use.       Dysuria - Primary   Relevant Orders   Urine Culture   Depression    Uncontrolled, worsened by recent event (abortion).  Also having significant anxiety. Recommended that she increase her sertraline to 1.5 tabs.  I also encouraged her to reach out to [facility] to schedule an appointment with a counselor.       Relevant Medications   sertraline (ZOLOFT) 50 MG tablet   Other Visit Diagnoses     Screening examination for STD (sexually transmitted disease)       Relevant Orders   Cervicovaginal ancillary only( [facility])   HIV Antibody (routine testing w rflx)       I have changed Elzena Jarvie's sertraline. I am also having her maintain her norethindrone-ethinyl estradiol-FE, loratadine, fluticasone, and montelukast.  Meds ordered this encounter  Medications   sertraline (ZOLOFT) 50 MG tablet    Sig: Take 1.5 tablets (75 mg total) by mouth at bedtime. 1/2 tab once daily for 1 week, then increase to a full tab once daily on week two    Dispense:  135 tablet    Refill:  0    Order Specific Question:   Supervising Provider    Answer:   Penni Homans A [[id number]]

## 2022-01-09 LAB — URINE CULTURE
MICRO NUMBER:: 13943813
Result:: NO GROWTH
SPECIMEN QUALITY:: ADEQUATE

## 2022-01-09 LAB — CERVICOVAGINAL ANCILLARY ONLY
Chlamydia: NEGATIVE
Comment: NEGATIVE
Comment: NEGATIVE
Comment: NORMAL
Neisseria Gonorrhea: NEGATIVE
Trichomonas: NEGATIVE

## 2022-01-09 LAB — HIV ANTIBODY (ROUTINE TESTING W REFLEX): HIV 1&2 Ab, 4th Generation: NONREACTIVE

## 2022-01-27 ENCOUNTER — Encounter: Payer: Self-pay | Admitting: Family

## 2022-01-28 MED ORDER — FLUCONAZOLE 150 MG PO TABS: ORAL_TABLET | ORAL | 0 refills | Status: DC

## 2022-02-05 ENCOUNTER — Ambulatory Visit: Payer: BC Managed Care – PPO | Admitting: Family

## 2022-02-07 ENCOUNTER — Ambulatory Visit: Payer: BC Managed Care – PPO | Admitting: Family

## 2022-02-10 ENCOUNTER — Other Ambulatory Visit: Payer: Self-pay | Admitting: Family

## 2022-02-10 MED ORDER — SERTRALINE HCL 50 MG PO TABS: 75.0000 mg | ORAL_TABLET | Freq: Every day | ORAL | 0 refills | Status: DC

## 2022-02-14 ENCOUNTER — Ambulatory Visit: Payer: BC Managed Care – PPO | Admitting: Family

## 2022-02-22 DIAGNOSIS — R051 Acute cough: Secondary | ICD-10-CM | POA: Diagnosis not present

## 2022-03-07 ENCOUNTER — Other Ambulatory Visit (HOSPITAL_COMMUNITY)
Admission: RE | Admit: 2022-03-07 | Discharge: 2022-03-07 | Disposition: A | Discharge status: Home / Self Care | Payer: BC Managed Care – PPO | Source: Ambulatory Visit | Attending: Family | Admitting: Family

## 2022-03-07 ENCOUNTER — Ambulatory Visit (INDEPENDENT_AMBULATORY_CARE_PROVIDER_SITE_OTHER): Payer: BC Managed Care – PPO | Admitting: Family

## 2022-03-07 ENCOUNTER — Encounter: Payer: Self-pay | Admitting: Family

## 2022-03-07 VITALS — BP 114/61 | HR 77 | Temp 98.3°F | Resp 16 | Ht 68.5 in | Wt 209.0 lb

## 2022-03-07 DIAGNOSIS — F419 Anxiety disorder, unspecified: Secondary | ICD-10-CM | POA: Diagnosis not present

## 2022-03-07 DIAGNOSIS — Z1159 Encounter for screening for other viral diseases: Secondary | ICD-10-CM | POA: Diagnosis not present

## 2022-03-07 DIAGNOSIS — Z23 Encounter for immunization: Secondary | ICD-10-CM

## 2022-03-07 DIAGNOSIS — Z01419 Encounter for gynecological examination (general) (routine) without abnormal findings: Secondary | ICD-10-CM | POA: Diagnosis not present

## 2022-03-07 DIAGNOSIS — Z0000 Encounter for general adult medical examination without abnormal findings: Secondary | ICD-10-CM | POA: Diagnosis not present

## 2022-03-07 DIAGNOSIS — Z309 Encounter for contraceptive management, unspecified: Secondary | ICD-10-CM

## 2022-03-07 DIAGNOSIS — F32A Depression, unspecified: Secondary | ICD-10-CM

## 2022-03-07 LAB — TSH: TSH: 1.85 u[IU]/mL (ref 0.35–5.50)

## 2022-03-07 LAB — COMPREHENSIVE METABOLIC PANEL
ALT: 10 U/L (ref 0–35)
AST: 13 U/L (ref 0–37)
Albumin: 4 g/dL (ref 3.5–5.2)
Alkaline Phosphatase: 37 U/L — ABNORMAL LOW (ref 39–117)
BUN: 14 mg/dL (ref 6–23)
CO2: 27 mEq/L (ref 19–32)
Calcium: 8.8 mg/dL (ref 8.4–10.5)
Chloride: 106 mEq/L (ref 96–112)
Creatinine, Ser: 0.84 mg/dL (ref 0.40–1.20)
GFR: 99.49 mL/min (ref >60.00)
Glucose, Bld: 84 mg/dL (ref 70–99)
Potassium: 4.5 mEq/L (ref 3.5–5.1)
Sodium: 137 mEq/L (ref 135–145)
Total Bilirubin: 0.7 mg/dL (ref 0.2–1.2)
Total Protein: 6.5 g/dL (ref 6.0–8.3)

## 2022-03-07 LAB — CBC WITH DIFFERENTIAL/PLATELET
Basophils Absolute: 0 10*3/uL (ref 0.0–0.1)
Basophils Relative: 0.8 % (ref 0.0–3.0)
Eosinophils Absolute: 0.1 10*3/uL (ref 0.0–0.7)
Eosinophils Relative: 2.5 % (ref 0.0–5.0)
HCT: 37.9 % (ref 36.0–46.0)
Hemoglobin: 12.5 g/dL (ref 12.0–15.0)
Lymphocytes Relative: 28.8 % (ref 12.0–46.0)
Lymphs Abs: 1.7 10*3/uL (ref 0.7–4.0)
MCHC: 32.9 g/dL (ref 30.0–36.0)
MCV: 87.8 fl (ref 78.0–100.0)
Monocytes Absolute: 0.3 10*3/uL (ref 0.1–1.0)
Monocytes Relative: 5.7 % (ref 3.0–12.0)
Neutro Abs: 3.6 10*3/uL (ref 1.4–7.7)
Neutrophils Relative %: 62.2 % (ref 43.0–77.0)
Platelets: 295 10*3/uL (ref 150.0–400.0)
RBC: 4.31 Mil/uL (ref 3.87–5.11)
RDW: 13.6 % (ref 11.5–15.5)
WBC: 5.8 10*3/uL (ref 4.0–10.5)

## 2022-03-07 LAB — LIPID PANEL
Cholesterol: 141 mg/dL (ref 0–200)
HDL: 46.4 mg/dL (ref >39.00)
LDL Cholesterol: 86 mg/dL (ref 0–99)
NonHDL: 94.73
Total CHOL/HDL Ratio: 3
Triglycerides: 42 mg/dL (ref 0.0–149.0)
VLDL: 8.4 mg/dL (ref 0.0–40.0)

## 2022-03-07 MED ORDER — ESCITALOPRAM OXALATE 10 MG PO TABS: ORAL_TABLET | ORAL | 0 refills | Status: DC

## 2022-03-07 NOTE — Assessment & Plan Note (Signed)
Uncontrolled- trial of lexapro- refer to counseling.

## 2022-03-07 NOTE — Progress Notes (Addendum)
Subjective:   By signing my name below, I, Shehryar Baig, attest that this documentation has been prepared under the direction and in the presence of Debbrah Alar, NP. 03/07/2022      Patient ID: Elizabeth Harmon, female    DOB: Aug 31, 2000, 21 y.o.   MRN: [id number]  Chief Complaint  Patient presents with   Annual Exam    HPI Patient is in today for a comprehensive physical exam.   Bowel movements: She reports having bowel movements immediately after eating a meal. She typically has 4 bowel movements daily. She denies having diarrhea.   Anxiety: She had anxiety during her last visit due to recovering from a procedure. Her Zoloft dosage was increased but she stopped taking it in between her last visit and today. She is not doing any counseling. Her anxiety is worse than her mood. Stress is the main cause of her anxiety. She is in school and working at this time. She is open to trying a new medication and seeing a counselor. She is not sure if her school offers counseling.   Acute: She denies having any fever, new muscle pain, joint pain, new moles, congestion, sinus pain, sore throat, chest pain, palpations, cough, SOB, wheezing, n/v/d, constipation, blood in stool, dysuria, frequency, weight change, adenopathy, hematuria, depression, anxiety, headaches at this time.  Social history: She has no changes to her family medical history. She has no new surgical procedures to report.   Immunizations: She is interested in hepatitis C screening during her blood work. She does not have any Covid-19 vaccines at this time. She is not interested in receiving the flu vaccine at this time. She is interested in receiving it at another time. She is due for a tetanus vaccine and is interested in receiving it during this visit.   Diet: She is not maintaining a healthy diet. She is in her first year of college so much of her habits have changed. She is stopped taking her birth control due to gaining weight  while taking it.  Wt Readings from Last 3 Encounters:  03/07/22 209 lb (94.8 kg)  01/08/22 199 lb (90.3 kg)  12/20/21 200 lb (90.7 kg)   Exercise: She is participating in regular exercise by running on a treadmill, weight lifting, and using a stairs exercise.   Pap Smear: She is completing her pap smear during this visit.   Dental: She is UTD on dental care.   Vision: She has an upcomming eye doctor appointment.   Health Maintenance Due  Topic Date Due   COVID-19 Vaccine (1) Never done   Hepatitis C Screening  Never done   TETANUS/TDAP  12/10/2021   PAP-Cervical Cytology Screening  Never done   PAP SMEAR-Modifier  Never done    History reviewed. No pertinent past medical history.  Past Surgical History:  Procedure Laterality Date   EYE SURGERY     tear duct   OTHER SURGICAL HISTORY Bilateral    tear duct    TONSILLECTOMY Bilateral 04/19/2020   Procedure: TONSILLECTOMY;  Surgeon: Jason Coop, DO;  Location: [facility];  Service: ENT;  Laterality: Bilateral;    Family History  Problem Relation Age of Onset   Hyperthyroidism Mother    Diabetes Father    Hyperlipidemia Father    Hypertension Father    Cancer Maternal Grandfather        prostate   Arthritis Maternal Grandfather    Heart attack Maternal Uncle    Cancer Paternal Aunt  lung   Heart attack Paternal Uncle    Cancer Paternal Uncle        lung   Sudden death Neg Hx     Social History   Socioeconomic History   Marital status: Single    Spouse name: Not on file   Number of children: Not on file   Years of education: Not on file   Highest education level: Not on file  Occupational History   Not on file  Tobacco Use   Smoking status: Never   Smokeless tobacco: Never  Vaping Use   Vaping Use: Never used  Substance and Sexual Activity   Alcohol use: No   Drug use: No   Sexual activity: Not on file  Other Topics Concern   Not on file  Social History Narrative    Plans to attend [employer] in [place] fall 2023   Enjoys television, basketball, reading   Does well in school      Social Determinants of Health   Financial Resource Strain: Not on file  Food Insecurity: Not on file  Transportation Needs: Not on file  Physical Activity: Not on file  Stress: Not on file  Social Connections: Not on file  Intimate Partner Violence: Not on file    Outpatient Medications Prior to Visit  Medication Sig Dispense Refill   fluticasone (FLONASE) 50 MCG/ACT nasal spray Place 2 sprays into both nostrils daily. 16 g 6   loratadine (CLARITIN) 10 MG tablet Take 1 tablet (10 mg total) by mouth daily. 30 tablet 11   montelukast (SINGULAIR) 10 MG tablet Take 1 tablet (10 mg total) by mouth at bedtime. 90 tablet 1   norethindrone-ethinyl estradiol-FE (LOESTRIN FE) 1-20 MG-MCG tablet Take 1 tablet by mouth daily. (Patient not taking: Reported on 12/20/2021) 84 tablet 3   fluconazole (DIFLUCAN) 150 MG tablet Take 1 tab by mouth today. May repeat in 3 days if needed. 2 tablet 0   sertraline (ZOLOFT) 50 MG tablet Take 1.5 tablets (75 mg total) by mouth at bedtime. 1/2 tab once daily for 1 week, then increase to a full tab once daily on week two (Patient not taking: Reported on 03/07/2022) 135 tablet 0   No facility-administered medications prior to visit.    No Known Allergies  Review of Systems  Constitutional:  Negative for fever.       (-)unexpected weight change (-)Adenopathy  HENT:  Negative for congestion, sinus pain and sore throat.   Eyes:        (-)Visual disturbance  Respiratory:  Negative for cough, shortness of breath and wheezing.   Cardiovascular:  Negative for chest pain, palpitations and leg swelling.  Gastrointestinal:  Negative for blood in stool, constipation, diarrhea, nausea and vomiting.  Genitourinary:  Negative for dysuria, frequency and hematuria.  Musculoskeletal:        (-)new muscle pain (-)new joint pain   Skin:        (-)new moles  Neurological:  Negative for dizziness and headaches.  Psychiatric/Behavioral:  Negative for depression. The patient is not nervous/anxious.        Objective:    Physical Exam Constitutional:      General: She is not in acute distress.    Appearance: Normal appearance. She is not ill-appearing.  HENT:     Head: Normocephalic and atraumatic.     Right Ear: Tympanic membrane, ear canal and external ear normal.     Left Ear: Tympanic membrane, ear canal and external  ear normal.  Eyes:     Extraocular Movements: Extraocular movements intact.     Right eye: No nystagmus.     Left eye: No nystagmus.     Pupils: Pupils are equal, round, and reactive to light.  Neck:     Thyroid: No thyroid tenderness.  Cardiovascular:     Rate and Rhythm: Normal rate and regular rhythm.     Heart sounds: Normal heart sounds. No murmur heard.    No gallop.  Pulmonary:     Effort: Pulmonary effort is normal. No respiratory distress.     Breath sounds: Normal breath sounds. No wheezing or rales.  Abdominal:     General: There is no distension.     Palpations: Abdomen is soft.     Tenderness: There is no abdominal tenderness. There is no guarding.  Genitourinary:    Vagina: Normal.     Cervix: Normal.  Musculoskeletal:     Comments: 5/5 strength in both upper and lower extremities  Lymphadenopathy:     Cervical: No cervical adenopathy.  Skin:    General: Skin is warm and dry.  Neurological:     Mental Status: She is alert and oriented to person, place, and time.  Psychiatric:        Judgment: Judgment normal.   Breasts: Examined lying and sitting.  Right: Without masses, retractions, discharge or axillary adenopathy.  Left: Without masses, retractions, discharge or axillary adenopathy.  Inguinal/mons: Normal without inguinal adenopathy  External genitalia: Normal  BUS/Urethra/Skene's glands: Normal  Bladder: Normal  Vagina: Normal  Cervix: Normal  Uterus:  normal in size, shape and contour. Midline and mobile  Adnexa/parametria:  Rt: Without masses or tenderness.  Lt: Without masses or tenderness.  Anus and perineum: Normal   BP 114/61 (BP Location: Right Arm, Patient Position: Sitting, Cuff Size: Large)   Pulse 77   Temp 98.3 F (36.8 C) (Oral)   Resp 16   Ht 5' 8.5" (1.74 m)   Wt 209 lb (94.8 kg)   SpO2 100%   BMI 31.32 kg/m  Wt Readings from Last 3 Encounters:  03/07/22 209 lb (94.8 kg)  01/08/22 199 lb (90.3 kg)  12/20/21 200 lb (90.7 kg)          01/08/2022    8:48 AM 10/18/2021    3:15 PM  GAD 7 : Generalized Anxiety Score  Nervous, Anxious, on Edge 3 1  Control/stop worrying 3 2  Worry too much - different things 3 3  Trouble relaxing 3 0  Restless 3 1  Easily annoyed or irritable 0 3  Afraid - awful might happen 0 3  Total GAD 7 Score 15 13  Anxiety Difficulty  Somewhat difficult       01/08/2022    8:47 AM 12/20/2021    9:48 AM 10/18/2021    3:15 PM 10/05/2020    9:07 AM 12/14/2017    8:24 AM  Depression screen PHQ 2/9  Decreased Interest 2 0 2 0 0  Down, Depressed, Hopeless 2 0 2 0 0  PHQ - 2 Score 4 0 4 0 0  Altered sleeping 3  3    Tired, decreased energy 3  3    Change in appetite 3  3    Feeling bad or failure about yourself  2  2    Trouble concentrating 2  2    Moving slowly or fidgety/restless 2  2    Suicidal thoughts 2  1    PHQ-9  Score 21  20    Difficult doing work/chores   Very difficult      Assessment & Plan:   Problem List Items Addressed This Visit       Unprioritized   Preventative health care - Primary    Wt Readings from Last 3 Encounters:  03/07/22 209 lb (94.8 kg)  01/08/22 199 lb (90.3 kg)  12/20/21 200 lb (90.7 kg)  Continue exercise.  Discussed healthy diet, safe sex.  She does not wish to restart OCP. She is interested in IUD placement with GYN- plans to use condoms for birth control in the meantime.      Relevant Orders   CBC with Differential/Platelet   Comp Met  (CMET)   Lipid panel   TSH   Anxiety and depression    Uncontrolled- trial of lexapro- refer to counseling.       Relevant Medications   escitalopram (LEXAPRO) 10 MG tablet   Other Visit Diagnoses     Need for hepatitis C screening test       Relevant Orders   Hepatitis C Antibody   Encounter for routine gynecological examination with Papanicolaou smear of cervix       Relevant Orders   Cytology - PAP( [facility])   Needs flu shot       Relevant Orders   Flu Vaccine QUAD 6+ mos PF IM (Fluarix Quad PF)   Encounter for contraceptive management, unspecified type       Relevant Orders   Ambulatory referral to Obstetrics / Gynecology        Meds ordered this encounter  Medications   escitalopram (LEXAPRO) 10 MG tablet    Sig: 1/2 tab once daily for 1 week, then increase to a full tab once daily on week two    Dispense:  30 tablet    Refill:  0    Order Specific Question:   Supervising Provider    Answer:   Penni Homans A [[id number]]    I, Nance Pear, NP, personally preformed the services described in this documentation.  All medical record entries made by the scribe were at my direction and in my presence.  I have reviewed the chart and discharge instructions (if applicable) and agree that the record reflects my personal performance and is accurate and complete. 03/07/2022   I,Shehryar Baig,acting as a Education administrator for Nance Pear, NP.,have documented all relevant documentation on the behalf of Nance Pear, NP,as directed by  Nance Pear, NP while in the presence of Nance Pear, NP.   Nance Pear, NP

## 2022-03-07 NOTE — Assessment & Plan Note (Addendum)
Wt Readings from Last 3 Encounters:  03/07/22 209 lb (94.8 kg)  01/08/22 199 lb (90.3 kg)  12/20/21 200 lb (90.7 kg)   Continue exercise.  Discussed healthy diet, safe sex.  She does not wish to restart OCP. She is interested in IUD placement with GYN- plans to use condoms for birth control in the meantime. Tdap today.

## 2022-03-08 LAB — HEPATITIS C ANTIBODY: Hepatitis C Ab: NONREACTIVE

## 2022-03-10 LAB — CYTOLOGY - PAP
Adequacy: ABSENT
Chlamydia: NEGATIVE
Comment: NEGATIVE
Diagnosis: NEGATIVE

## 2022-03-11 ENCOUNTER — Telehealth: Payer: Self-pay | Admitting: Family

## 2022-03-11 MED ORDER — FLUCONAZOLE 150 MG PO TABS: 150.0000 mg | ORAL_TABLET | Freq: Every day | ORAL | 0 refills | Status: DC

## 2022-03-11 NOTE — Telephone Encounter (Signed)
Pap normal, chlamydia testing negative.  Note was made of yeast infection. Will rx with diflucan.

## 2022-03-26 ENCOUNTER — Telehealth: Payer: Self-pay | Admitting: Family

## 2022-03-26 NOTE — Telephone Encounter (Signed)
Spoke w/ Pt- informed ready for pick up at front desk.

## 2022-03-26 NOTE — Telephone Encounter (Signed)
Patient called to request copy of notes from her last physical and her shot records for school. Patient would like to pick up. Please call to advise when ready.

## 2022-03-28 ENCOUNTER — Telehealth: Payer: BC Managed Care – PPO | Admitting: Family

## 2022-03-28 ENCOUNTER — Encounter: Payer: Self-pay | Admitting: Family

## 2022-03-28 ENCOUNTER — Ambulatory Visit: Payer: BC Managed Care – PPO | Admitting: Family

## 2022-03-28 VITALS — Ht 68.5 in

## 2022-03-28 DIAGNOSIS — Z91199 Patient's noncompliance with other medical treatment and regimen due to unspecified reason: Secondary | ICD-10-CM

## 2022-03-28 NOTE — Progress Notes (Shared)
Subjective:   By signing my name below, I, Cassell Clement, attest that this documentation has been prepared under the direction and in the presence of Alma Downs' Su[place], NP 03/28/2022   Patient ID: Elizabeth Harmon, female    DOB: Dec 06, 2000, 21 y.o.   MRN: [id number]  No chief complaint on file.   HPI Patient is in today for an office visit  Health Maintenance Due  Topic Date Due   COVID-19 Vaccine (1) Never done    No past medical history on file.  Past Surgical History:  Procedure Laterality Date   EYE SURGERY     tear duct   OTHER SURGICAL HISTORY Bilateral    tear duct    TONSILLECTOMY Bilateral 04/19/2020   Procedure: TONSILLECTOMY;  Surgeon: Laren Boom, DO;  Location: [facility];  Service: ENT;  Laterality: Bilateral;    Family History  Problem Relation Age of Onset   Hyperthyroidism Mother    Diabetes Father    Hyperlipidemia Father    Hypertension Father    Cancer Maternal Grandfather        prostate   Arthritis Maternal Grandfather    Heart attack Maternal Uncle    Cancer Paternal Aunt        lung   Heart attack Paternal Uncle    Cancer Paternal Uncle        lung   Sudden death Neg Hx     Social History   Socioeconomic History   Marital status: Single    Spouse name: Not on file   Number of children: Not on file   Years of education: Not on file   Highest education level: Not on file  Occupational History   Not on file  Tobacco Use   Smoking status: Never   Smokeless tobacco: Never  Vaping Use   Vaping Use: Never used  Substance and Sexual Activity   Alcohol use: No   Drug use: No   Sexual activity: Not on file  Other Topics Concern   Not on file  Social History Narrative   Plans to attend [employer] in [place] fall 2023   Enjoys television, basketball, reading   Does well in school      Social Determinants of Health   Financial Resource Strain: Not on file  Food Insecurity: Not  on file  Transportation Needs: Not on file  Physical Activity: Not on file  Stress: Not on file  Social Connections: Not on file  Intimate Partner Violence: Not on file    Outpatient Medications Prior to Visit  Medication Sig Dispense Refill   escitalopram (LEXAPRO) 10 MG tablet 1/2 tab once daily for 1 week, then increase to a full tab once daily on week two 30 tablet 0   fluconazole (DIFLUCAN) 150 MG tablet Take 1 tablet (150 mg total) by mouth daily. 30 tablet 0   fluticasone (FLONASE) 50 MCG/ACT nasal spray Place 2 sprays into both nostrils daily. 16 g 6   loratadine (CLARITIN) 10 MG tablet Take 1 tablet (10 mg total) by mouth daily. 30 tablet 11   montelukast (SINGULAIR) 10 MG tablet Take 1 tablet (10 mg total) by mouth at bedtime. 90 tablet 1   norethindrone-ethinyl estradiol-FE (LOESTRIN FE) 1-20 MG-MCG tablet Take 1 tablet by mouth daily. (Patient not taking: Reported on 12/20/2021) 84 tablet 3   No facility-administered medications prior to visit.    No Known Allergies  ROS     Objective:  Physical Exam Constitutional:      General: She is not in acute distress.    Appearance: Normal appearance. She is not ill-appearing.  HENT:     Head: Normocephalic and atraumatic.     Right Ear: External ear normal.     Left Ear: External ear normal.  Eyes:     Extraocular Movements: Extraocular movements intact.     Pupils: Pupils are equal, round, and reactive to light.  Cardiovascular:     Rate and Rhythm: Normal rate and regular rhythm.     Heart sounds: Normal heart sounds. No murmur heard.    No gallop.  Pulmonary:     Effort: Pulmonary effort is normal. No respiratory distress.     Breath sounds: Normal breath sounds. No wheezing or rales.  Skin:    General: Skin is warm and dry.  Neurological:     Mental Status: She is alert and oriented to person, place, and time.  Psychiatric:        Mood and Affect: Mood normal.        Behavior: Behavior normal.         Judgment: Judgment normal.     There were no vitals taken for this visit. Wt Readings from Last 3 Encounters:  03/07/22 209 lb (94.8 kg)  01/08/22 199 lb (90.3 kg)  12/20/21 200 lb (90.7 kg)       Assessment & Plan:   Problem List Items Addressed This Visit   None  No orders of the defined types were placed in this encounter.   I, Cassell Clement, personally preformed the services described in this documentation.  All medical record entries made by the scribe were at my direction and in my presence.  I have reviewed the chart and discharge instructions (if applicable) and agree that the record reflects my personal performance and is accurate and complete. 03/28/2022   I,Amber Collins,acting as a scribe for Lemont Fillers, NP.,have documented all relevant documentation on the behalf of Lemont Fillers, NP,as directed by  Lemont Fillers, NP while in the presence of Lemont Fillers, NP.    [employer]

## 2022-03-28 NOTE — Progress Notes (Signed)
Pt no showed for appointment. Not available by phone.

## 2022-05-05 ENCOUNTER — Telehealth: Payer: Self-pay | Admitting: Family

## 2022-05-05 DIAGNOSIS — M25511 Pain in right shoulder: Secondary | ICD-10-CM

## 2022-05-05 NOTE — Telephone Encounter (Signed)
Pt called to see if patient would like a local or a sports med office near her college lvm to return call

## 2022-05-05 NOTE — Telephone Encounter (Signed)
Patient stopped by and stated she needs her referral for sports medicine resent for her shoulder. Please advise.

## 2022-05-06 NOTE — Telephone Encounter (Signed)
Patient was referred to spots medicine a year ago but did not follow up on referral.  She is still having bilateral shoulder pain and will like to be referred again. [facility] ok per patient.  Please advised if ok or if she needs ov for this.

## 2022-05-09 ENCOUNTER — Ambulatory Visit (HOSPITAL_BASED_OUTPATIENT_CLINIC_OR_DEPARTMENT_OTHER)
Admission: RE | Admit: 2022-05-09 | Discharge: 2022-05-09 | Disposition: A | Discharge status: Home / Self Care | Payer: BC Managed Care – PPO | Source: Ambulatory Visit | Attending: Family Medicine | Admitting: Family Medicine

## 2022-05-09 ENCOUNTER — Ambulatory Visit (INDEPENDENT_AMBULATORY_CARE_PROVIDER_SITE_OTHER): Payer: BC Managed Care – PPO | Admitting: Family Medicine

## 2022-05-09 ENCOUNTER — Ambulatory Visit: Payer: Self-pay

## 2022-05-09 ENCOUNTER — Encounter: Payer: Self-pay | Admitting: Family Medicine

## 2022-05-09 VITALS — BP 116/72 | Ht 69.0 in | Wt 205.0 lb

## 2022-05-09 DIAGNOSIS — M0880 Other juvenile arthritis, unspecified site: Secondary | ICD-10-CM

## 2022-05-09 DIAGNOSIS — M25511 Pain in right shoulder: Secondary | ICD-10-CM | POA: Diagnosis not present

## 2022-05-09 DIAGNOSIS — M778 Other enthesopathies, not elsewhere classified: Secondary | ICD-10-CM | POA: Diagnosis not present

## 2022-05-09 MED ORDER — MELOXICAM 15 MG PO TABS: 15.0000 mg | ORAL_TABLET | Freq: Every day | ORAL | 1 refills | Status: DC | PRN

## 2022-05-09 NOTE — Patient Instructions (Signed)
Nice to meet you Please try heat on the area  Please work on the range of motion  We'll call with the results from today  We'll get the MRI at [facility]   Please send me a message in [place] with any questions or updates.  We'll setup a virtual visit once the MRI is resulted.   --Dr. Raeford Razor

## 2022-05-09 NOTE — Progress Notes (Signed)
  Elizabeth Harmon - 22 y.o. female MRN [id number]  Date of birth: November 15, 2000  SUBJECTIVE:  Including CC & ROS.  No chief complaint on file.   Elizabeth Harmon is a 22 y.o. female that is presenting with acute on chronic bilateral shoulder pain with the right being worse than the left.  The pain has been present for over a year.  Recently took a course of prednisone with no improvement.  No history of injury.  Pain is constant on a daily basis.  No history of surgery.  No family history that is contributory.  Localized to the shoulder.  She does have rashes on the extensor surface of her upper extremity.  No recent illnesses and no fevers.  No other joints that are affected.   Review of Systems See HPI   HISTORY: Past Medical, Surgical, Social, and Family History Reviewed & Updated per EMR.   Pertinent Historical Findings include:  History reviewed. No pertinent past medical history.  Past Surgical History:  Procedure Laterality Date   EYE SURGERY     tear duct   OTHER SURGICAL HISTORY Bilateral    tear duct    TONSILLECTOMY Bilateral 04/19/2020   Procedure: TONSILLECTOMY;  Surgeon: Jason Coop, DO;  Location: [facility];  Service: ENT;  Laterality: Bilateral;     PHYSICAL EXAM:  VS: BP 116/72   Ht 5\' 9"  (1.753 m)   Wt 205 lb (93 kg)   BMI 30.27 kg/m  Physical Exam Gen: NAD, alert, cooperative with exam, well-appearing MSK:  Right and left shoulder: Stiffness with external rotation and abduction. Passively able to reach full flexion. Weakness with resistance. Positive decant test. Positive O'Brien's test Neurovascularly intact    Limited ultrasound: Right shoulder pain:  Effusion noted within the bicipital tendon sheath. There is an os acromiale present. Normal-appearing AC joint. Subacromial bursitis appreciated. No change in the posterior glenohumeral joint  Summary: Nonspecific effusion appreciated within the shoulder joint.  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  The pain has been present for over 1 year.  She has a pain on a daily basis and had no response with previous prednisone courses.  Does have nonspecific effusion within the joint as well as different rashes and lymphadenopathy.  Concerning for juvenile idiopathic arthritis  -Counseled on home exercise therapy and supportive care. -CMP, CBC, sed rate, CRP, ANA panel. -X-ray. -MRI of the right shoulder to evaluate for synovitis and inflammatory changes and for presurgical planning.  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  Does have pain on the left but not as severe as the right.  Does have stiffness with external rotation and abduction on exam. -Counseled on home exercise therapy and supportive care. -Could consider further imaging.

## 2022-05-09 NOTE — Assessment & Plan Note (Signed)
Acute on chronic in nature.  Does have pain on the left but not as severe as the right.  Does have stiffness with external rotation and abduction on exam. -Counseled on home exercise therapy and supportive care. -Could consider further imaging.

## 2022-05-09 NOTE — Assessment & Plan Note (Signed)
Acute on chronic in nature.  The pain has been present for over 1 year.  She has a pain on a daily basis and had no response with previous prednisone courses.  Does have nonspecific effusion within the joint as well as different rashes and lymphadenopathy.  Concerning for juvenile idiopathic arthritis  -Counseled on home exercise therapy and supportive care. -CMP, CBC, sed rate, CRP, ANA panel. -X-ray. -MRI of the right shoulder to evaluate for synovitis and inflammatory changes and for presurgical planning.

## 2022-05-12 ENCOUNTER — Telehealth: Payer: Self-pay | Admitting: Family Medicine

## 2022-05-12 DIAGNOSIS — M0880 Other juvenile arthritis, unspecified site: Secondary | ICD-10-CM

## 2022-05-12 LAB — CBC
Hematocrit: 39.1 % (ref 34.0–46.6)
Hemoglobin: 12.9 g/dL (ref 11.1–15.9)
MCH: 28.3 pg (ref 26.6–33.0)
MCHC: 33 g/dL (ref 31.5–35.7)
MCV: 86 fL (ref 79–97)
Platelets: 304 10*3/uL (ref 150–450)
RBC: 4.56 x10E6/uL (ref 3.77–5.28)
RDW: 12.2 % (ref 11.7–15.4)
WBC: 3.4 10*3/uL (ref 3.4–10.8)

## 2022-05-12 LAB — ANA,IFA RA DIAG PNL W/RFLX TIT/PATN
ANA Titer 1: NEGATIVE
Cyclic Citrullin Peptide Ab: 6 units (ref 0–19)
Rheumatoid fact SerPl-aCnc: <10 IU/mL (ref <14.0)

## 2022-05-12 LAB — COMPREHENSIVE METABOLIC PANEL
ALT: 14 IU/L (ref 0–32)
AST: 19 IU/L (ref 0–40)
Albumin/Globulin Ratio: 1.7 (ref 1.2–2.2)
Albumin: 4.4 g/dL (ref 4.0–5.0)
Alkaline Phosphatase: 43 IU/L — ABNORMAL LOW (ref 44–121)
BUN/Creatinine Ratio: 8 — ABNORMAL LOW (ref 9–23)
BUN: 6 mg/dL (ref 6–20)
Bilirubin Total: 0.4 mg/dL (ref 0.0–1.2)
CO2: 21 mmol/L (ref 20–29)
Calcium: 9.1 mg/dL (ref 8.7–10.2)
Chloride: 114 mmol/L — ABNORMAL HIGH (ref 96–106)
Creatinine, Ser: 0.78 mg/dL (ref 0.57–1.00)
Globulin, Total: 2.6 g/dL (ref 1.5–4.5)
Glucose: 87 mg/dL (ref 70–99)
Potassium: 5 mmol/L (ref 3.5–5.2)
Sodium: 149 mmol/L — ABNORMAL HIGH (ref 134–144)
Total Protein: 7 g/dL (ref 6.0–8.5)
eGFR: 111 mL/min/{1.73_m2} (ref >59)

## 2022-05-12 LAB — C-REACTIVE PROTEIN: CRP: 10 mg/L (ref 0–10)

## 2022-05-12 LAB — SEDIMENTATION RATE: Sed Rate: 5 mm/hr (ref 0–32)

## 2022-05-12 NOTE — Telephone Encounter (Signed)
Left VM for patient. If she calls back please have her speak with a nurse/CMA and inform that the xrays are normal and lab work showed no significant abnormalities. Will continue with MRi.   If any questions then please take the best time and phone number to call and I will try to call her back.   Rosemarie Ax, MD [facility] 05/12/2022, 9:40 AM

## 2022-05-19 ENCOUNTER — Other Ambulatory Visit: Payer: BC Managed Care – PPO

## 2022-05-20 ENCOUNTER — Encounter: Payer: Self-pay | Admitting: Family Medicine

## 2022-05-26 NOTE — Telephone Encounter (Signed)
Received fax from [facility]. MRI of Right shoulder w/o contrast is denied after further physician courtesy review.  Please advise next steps.

## 2022-05-26 NOTE — Telephone Encounter (Signed)
Left message for patient to call back to see if she prefers a specific PT facility. I will place PT referral.

## 2022-05-27 NOTE — Telephone Encounter (Signed)
Pt informed of below.  Patient recently moved to [facility]. She request PT near [facility] area. PT referral placed and faxed to [facility], [place] Fax #  [phone]

## 2022-06-04 DIAGNOSIS — M25512 Pain in left shoulder: Secondary | ICD-10-CM | POA: Diagnosis not present

## 2022-06-04 DIAGNOSIS — M25511 Pain in right shoulder: Secondary | ICD-10-CM | POA: Diagnosis not present

## 2022-06-04 DIAGNOSIS — M2560 Stiffness of unspecified joint, not elsewhere classified: Secondary | ICD-10-CM | POA: Diagnosis not present

## 2022-06-27 ENCOUNTER — Other Ambulatory Visit: Payer: Self-pay | Admitting: Family

## 2022-06-27 ENCOUNTER — Encounter: Payer: Self-pay | Admitting: Family

## 2022-06-27 MED ORDER — FLUCONAZOLE 150 MG PO TABS: 150.0000 mg | ORAL_TABLET | Freq: Once | ORAL | 0 refills | Status: AC

## 2022-07-08 ENCOUNTER — Encounter: Payer: Self-pay | Admitting: Family

## 2022-07-08 ENCOUNTER — Telehealth (INDEPENDENT_AMBULATORY_CARE_PROVIDER_SITE_OTHER): Payer: BC Managed Care – PPO | Admitting: Family

## 2022-07-08 VITALS — Resp 18 | Ht 69.0 in

## 2022-07-08 DIAGNOSIS — N898 Other specified noninflammatory disorders of vagina: Secondary | ICD-10-CM | POA: Diagnosis not present

## 2022-07-08 NOTE — Progress Notes (Signed)
Elizabeth Harmon is a 22 y.o. female with the following history as recorded in EpicCare:  Patient Active Problem List   Diagnosis Date Noted   Juvenile idiopathic arthritis ([place]) 05/09/2022   Dysuria 01/08/2022   Anxiety and depression 10/18/2021   Allergic rhinitis 05/01/2021   Capsulitis of left shoulder 05/01/2021   Preventative health care 10/05/2020   Globus sensation 10/05/2020   Irregular menses 04/28/2014   Migraines 04/28/2014   Morbid obesity ([place]) 04/28/2014    Current Outpatient Medications  Medication Sig Dispense Refill   fluticasone (FLONASE) 50 MCG/ACT nasal spray Place 2 sprays into both nostrils daily. 16 g 6   escitalopram (LEXAPRO) 10 MG tablet 1/2 tab once daily for 1 week, then increase to a full tab once daily on week two (Patient not taking: Reported on 07/08/2022) 30 tablet 0   loratadine (CLARITIN) 10 MG tablet Take 1 tablet (10 mg total) by mouth daily. (Patient not taking: Reported on 07/08/2022) 30 tablet 11   meloxicam (MOBIC) 15 MG tablet Take 1 tablet (15 mg total) by mouth daily as needed. (Patient not taking: Reported on 07/08/2022) 45 tablet 1   montelukast (SINGULAIR) 10 MG tablet Take 1 tablet (10 mg total) by mouth at bedtime. (Patient not taking: Reported on 07/08/2022) 90 tablet 1   norethindrone-ethinyl estradiol-FE (LOESTRIN FE) 1-20 MG-MCG tablet Take 1 tablet by mouth daily. (Patient not taking: Reported on 12/20/2021) 84 tablet 3   No current facility-administered medications for this visit.    Allergies: Patient has no known allergies.  No past medical history on file.  Past Surgical History:  Procedure Laterality Date   EYE SURGERY     tear duct   OTHER SURGICAL HISTORY Bilateral    tear duct    TONSILLECTOMY Bilateral 04/19/2020   Procedure: TONSILLECTOMY;  Surgeon: Jason Coop, DO;  Location: [facility];  Service: ENT;  Laterality: Bilateral;    Family History  Problem Relation Age of Onset   Hyperthyroidism Mother     Diabetes Father    Hyperlipidemia Father    Hypertension Father    Cancer Maternal Grandfather        prostate   Arthritis Maternal Grandfather    Heart attack Maternal Uncle    Cancer Paternal Aunt        lung   Heart attack Paternal Uncle    Cancer Paternal Uncle        lung   Sudden death Neg Hx     Social History   Tobacco Use   Smoking status: Never   Smokeless tobacco: Never  Substance Use Topics   Alcohol use: No    Subjective:    I connected with Shelba Flake on 07/08/22 at  1:40 PM EDT by a video enabled telemedicine application and verified that I am speaking with the correct person using two identifiers.   I discussed the limitations of evaluation and management by telemedicine and the availability of in person appointments. The patient expressed understanding and agreed to proceed.   Provider in office/ patient is at home; provider and patient are only 2 people on video call.   Patient is concerned about "bumps" in her vaginal area; notes these bumps have "come and gone" for at least the past year; denies any prior history of herpes;   Objective:  Vitals:   07/08/22 1338  Resp: 18  Height: 5\' 9"  (1.753 m)    General: Well developed, well nourished, in no acute distress  Skin :  Warm and dry.  Head: Normocephalic and atraumatic  Lungs: Respirations unlabored;  Neurologic: Alert and oriented; speech intact; face symmetrical; moves all extremities well; CNII-XII intact without focal deficit   Assessment:  1. Vaginal lesion     Plan:  Patient is able to send picture of areas of concern- discussed with her that am suspicious for herpes lesion and recommend that she be seen in person to have the areas cultured; she is currently in [place] but notes she can drive to the office for appointment tomorrow; scheduled to see Caleen Jobs tomorrow afternoon;  No follow-ups on file.  No orders of the defined types were placed in this encounter.   Requested  Prescriptions    No prescriptions requested or ordered in this encounter

## 2022-07-09 ENCOUNTER — Other Ambulatory Visit (HOSPITAL_COMMUNITY)
Admission: RE | Admit: 2022-07-09 | Discharge: 2022-07-09 | Disposition: A | Discharge status: Home / Self Care | Payer: BC Managed Care – PPO | Source: Ambulatory Visit | Attending: Family Medicine | Admitting: Family Medicine

## 2022-07-09 ENCOUNTER — Encounter: Payer: Self-pay | Admitting: Family Medicine

## 2022-07-09 ENCOUNTER — Ambulatory Visit: Payer: BC Managed Care – PPO | Admitting: Family Medicine

## 2022-07-09 VITALS — BP 122/68 | HR 86 | Resp 18 | Ht 69.0 in | Wt 218.8 lb

## 2022-07-09 DIAGNOSIS — B379 Candidiasis, unspecified: Secondary | ICD-10-CM | POA: Diagnosis not present

## 2022-07-09 DIAGNOSIS — N949 Unspecified condition associated with female genital organs and menstrual cycle: Secondary | ICD-10-CM | POA: Insufficient documentation

## 2022-07-09 DIAGNOSIS — Z113 Encounter for screening for infections with a predominantly sexual mode of transmission: Secondary | ICD-10-CM

## 2022-07-09 MED ORDER — VALACYCLOVIR HCL 500 MG PO TABS: 500.0000 mg | ORAL_TABLET | Freq: Two times a day (BID) | ORAL | 0 refills | Status: AC

## 2022-07-09 NOTE — Patient Instructions (Signed)
Screening swabs and blood work today Will go ahead and give you some Valtrex for possible herpes lesions - testing will let us know for sure

## 2022-07-09 NOTE — Progress Notes (Unsigned)
Acute Office Visit  Subjective:     Patient ID: Elizabeth Harmon, female    DOB: 2000/10/05, 22 y.o.   MRN: [id number]  Chief Complaint  Patient presents with   Genital Warts    HPI Patient is in today for genital lesions.   She had a video visit with one of our providers yesterday, who recommend she be seen in-person for evaluation and testing. Patient reports the current lesions are 2 raised bumps to right pubic area (she sent pictures in a MyChart message yesterday). States these lesions have been present for about a week, are tender, and sometimes have a small amount of clear drainage. She reports she is sexually active with one female and does not use protection. No known exposures. Reports these areas have been coming and going for the past year or so, usually resolve on their own within a few days. Denies any fever, chills, vaginal pain/discharge, or pelvic pain.      ROS All review of systems negative except what is listed in the HPI      Objective:    BP 122/68 (BP Location: Right Arm, Patient Position: Sitting, Cuff Size: Normal)   Pulse 86   Resp 18   Ht 5\' 9"  (1.753 m)   Wt 218 lb 12.8 oz (99.2 kg)   LMP 06/09/2022   SpO2 100%   BMI 32.31 kg/m    Physical Exam Vitals reviewed. Exam conducted with a chaperone present.  Constitutional:      Appearance: Normal appearance.  Genitourinary:   Skin:    General: Skin is warm and dry.  Neurological:     General: No focal deficit present.     Mental Status: She is alert and oriented to person, place, and time. Mental status is at baseline.  Psychiatric:        Mood and Affect: Mood normal.        Behavior: Behavior normal.        Thought Content: Thought content normal.        Judgment: Judgment normal.     No results found for any visits on 07/09/22.      Assessment & Plan:   Problem List Items Addressed This Visit   None Visit Diagnoses     Genital lesion, female    -  Primary   Relevant Medications    valACYclovir (VALTREX) 500 MG tablet   Other Relevant Orders   CBC with Differential/Platelet   HIV Antibody (routine testing w rflx)   HSV(herpes simplex vrs) 1+2 ab-IgG   RPR   Cervicovaginal ancillary only   Herpes Simplex Virus Culture w/ Rflx to Typing   Screen for STD (sexually transmitted disease)       Relevant Orders   CBC with Differential/Platelet   HIV Antibody (routine testing w rflx)   HSV(herpes simplex vrs) 1+2 ab-IgG   RPR   Cervicovaginal ancillary only   Herpes Simplex Virus Culture w/ Rflx to Typing     Screening swabs and blood work today Will go ahead and give you some Valtrex for possible herpes lesions - testing will let us know for sure       Meds ordered this encounter  Medications   valACYclovir (VALTREX) 500 MG tablet    Sig: Take 1 tablet (500 mg total) by mouth 2 (two) times daily for 3 days.    Dispense:  6 tablet    Refill:  0    Order Specific Question:   Supervising Provider  Answer:   Penni Homans A [[id number]]    Return if symptoms worsen or fail to improve.  Terrilyn Saver, NP

## 2022-07-10 LAB — CBC WITH DIFFERENTIAL/PLATELET
Basophils Absolute: 0.1 10*3/uL (ref 0.0–0.1)
Basophils Relative: 0.9 % (ref 0.0–3.0)
Eosinophils Absolute: 0.1 10*3/uL (ref 0.0–0.7)
Eosinophils Relative: 2 % (ref 0.0–5.0)
HCT: 39.5 % (ref 36.0–46.0)
Hemoglobin: 13.1 g/dL (ref 12.0–15.0)
Lymphocytes Relative: 29.3 % (ref 12.0–46.0)
Lymphs Abs: 2 10*3/uL (ref 0.7–4.0)
MCHC: 33.1 g/dL (ref 30.0–36.0)
MCV: 87.3 fl (ref 78.0–100.0)
Monocytes Absolute: 0.3 10*3/uL (ref 0.1–1.0)
Monocytes Relative: 5 % (ref 3.0–12.0)
Neutro Abs: 4.2 10*3/uL (ref 1.4–7.7)
Neutrophils Relative %: 62.8 % (ref 43.0–77.0)
Platelets: 310 10*3/uL (ref 150.0–400.0)
RBC: 4.53 Mil/uL (ref 3.87–5.11)
RDW: 13.1 % (ref 11.5–15.5)
WBC: 6.7 10*3/uL (ref 4.0–10.5)

## 2022-07-10 LAB — HSV(HERPES SIMPLEX VRS) I + II AB-IGG
HAV 1 IGG,TYPE SPECIFIC AB: 2.21 index — ABNORMAL HIGH
HSV 2 IGG,TYPE SPECIFIC AB: <0.9 index

## 2022-07-10 LAB — RPR: RPR Ser Ql: NONREACTIVE

## 2022-07-10 LAB — HIV ANTIBODY (ROUTINE TESTING W REFLEX): HIV 1&2 Ab, 4th Generation: NONREACTIVE

## 2022-07-11 LAB — HERPES SIMPLEX VIRUS CULTURE W/RFLX TO TYPING
MICRO NUMBER:: 14717894
SPECIMEN QUALITY:: ADEQUATE

## 2022-07-11 LAB — CERVICOVAGINAL ANCILLARY ONLY
Bacterial Vaginitis (gardnerella): NEGATIVE
Candida Glabrata: NEGATIVE
Candida Vaginitis: POSITIVE — AB
Chlamydia: NEGATIVE
Comment: NEGATIVE
Comment: NEGATIVE
Comment: NEGATIVE
Comment: NEGATIVE
Comment: NEGATIVE
Comment: NORMAL
Neisseria Gonorrhea: NEGATIVE
Trichomonas: NEGATIVE

## 2022-07-13 MED ORDER — FLUCONAZOLE 150 MG PO TABS: 150.0000 mg | ORAL_TABLET | Freq: Every day | ORAL | 0 refills | Status: DC

## 2022-07-13 NOTE — Addendum Note (Signed)
Addended by: Caleen Jobs B on: 07/13/2022 09:30 PM   Modules accepted: Orders

## 2022-08-04 ENCOUNTER — Encounter: Payer: Self-pay | Admitting: *Deleted

## 2022-10-17 ENCOUNTER — Encounter: Payer: Self-pay | Admitting: Family

## 2022-10-17 NOTE — Telephone Encounter (Signed)
Patient scheduled to come in for tb evaluation and pubic cyst

## 2022-10-27 ENCOUNTER — Ambulatory Visit: Payer: BC Managed Care – PPO | Admitting: Family

## 2022-10-27 ENCOUNTER — Ambulatory Visit (HOSPITAL_BASED_OUTPATIENT_CLINIC_OR_DEPARTMENT_OTHER)
Admission: RE | Admit: 2022-10-27 | Discharge: 2022-10-27 | Disposition: A | Discharge status: Home / Self Care | Payer: BC Managed Care – PPO | Source: Ambulatory Visit | Attending: Family | Admitting: Family

## 2022-10-27 ENCOUNTER — Encounter: Payer: Self-pay | Admitting: Family

## 2022-10-27 VITALS — BP 109/66 | HR 75 | Temp 98.3°F | Resp 20 | Ht 69.0 in | Wt 211.0 lb

## 2022-10-27 DIAGNOSIS — Z9289 Personal history of other medical treatment: Secondary | ICD-10-CM

## 2022-10-27 DIAGNOSIS — L739 Follicular disorder, unspecified: Secondary | ICD-10-CM

## 2022-10-27 DIAGNOSIS — R09A2 Foreign body sensation, throat: Secondary | ICD-10-CM | POA: Diagnosis not present

## 2022-10-27 DIAGNOSIS — M25512 Pain in left shoulder: Secondary | ICD-10-CM

## 2022-10-27 DIAGNOSIS — N644 Mastodynia: Secondary | ICD-10-CM | POA: Diagnosis not present

## 2022-10-27 DIAGNOSIS — M25511 Pain in right shoulder: Secondary | ICD-10-CM | POA: Diagnosis not present

## 2022-10-27 DIAGNOSIS — R7611 Nonspecific reaction to tuberculin skin test without active tuberculosis: Secondary | ICD-10-CM | POA: Diagnosis not present

## 2022-10-27 MED ORDER — CEPHALEXIN 500 MG PO CAPS: 500.0000 mg | ORAL_CAPSULE | Freq: Three times a day (TID) | ORAL | 0 refills | Status: DC

## 2022-10-27 NOTE — Progress Notes (Signed)
Subjective:     Patient ID: Elizabeth Harmon, female    DOB: 03/22/2001, 22 y.o.   MRN: [id number]  Chief Complaint  Patient presents with   TB Evaluation   Cyst    Pubic area    HPI  Discussed the use of AI scribe software for clinical note transcription with the patient, who gave verbal consent to proceed.  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 She reports that her PPD test was positive, but her chest x-ray was normal. She was referred to the health department, but was not treated with antibiotics. She also reports having cysts in the pubic area that have been draining a yellowish-greenish fluid. She has tried various hair removal methods to manage her ingrown hairs, but still experiences cysts. She also reports shoulder pain, which has been evaluated by a sports medicine doctor. An MRI was recommended, but not approved by her insurance. She has tried physical therapy, but it did not alleviate her pain. She also reports a sensation of something being stuck in her throat, which she has been managing with reflux and allergy medications. She has noticed a change in the type of material she coughs up, which she describes as similar to a booger. She also reports soreness in her left breast, but does not notice a correlation with her menstrual cycle.      Patient reports she had positive PPD back in 2022.  Reports that her work sent her to an urgent care who did a cxr which was normal.     Health Maintenance Due  Topic Date Due   COVID-19 Vaccine (1) Never done    No past medical history on file.  Past Surgical History:  Procedure Laterality Date   EYE SURGERY     tear duct   OTHER SURGICAL HISTORY Bilateral    tear duct    TONSILLECTOMY Bilateral 04/19/2020   Procedure: TONSILLECTOMY;  Surgeon: Laren Boom, DO;  Location: [facility];  Service: ENT;  Laterality:  Bilateral;    Family History  Problem Relation Age of Onset   Hyperthyroidism Mother    Diabetes Father    Hyperlipidemia Father    Hypertension Father    Cancer Maternal Grandfather        prostate   Arthritis Maternal Grandfather    Heart attack Maternal Uncle    Cancer Paternal Aunt        lung   Heart attack Paternal Uncle    Cancer Paternal Uncle        lung   Sudden death Neg Hx     Social History   Socioeconomic History   Marital status: Single    Spouse name: Not on file   Number of children: Not on file   Years of education: Not on file   Highest education level: Not on file  Occupational History   Not on file  Tobacco Use   Smoking status: Never   Smokeless tobacco: Never  Vaping Use   Vaping Use: Never used  Substance and Sexual Activity   Alcohol use: No   Drug use: No   Sexual activity: Not on file  Other Topics Concern   Not on file  Social History Narrative   Plans to attend [employer] in [place] fall 2023   Enjoys television, basketball, reading   Does well in school  Social Determinants of Health   Financial Resource Strain: Not on file  Food Insecurity: Not on file  Transportation Needs: Not on file  Physical Activity: Not on file  Stress: Not on file  Social Connections: Not on file  Intimate Partner Violence: Not on file    Outpatient Medications Prior to Visit  Medication Sig Dispense Refill   escitalopram (LEXAPRO) 10 MG tablet 1/2 tab once daily for 1 week, then increase to a full tab once daily on week two 30 tablet 0   fluconazole (DIFLUCAN) 150 MG tablet Take 1 tablet (150 mg total) by mouth daily. May repeat in 3 days if needed. 2 tablet 0   fluticasone (FLONASE) 50 MCG/ACT nasal spray Place 2 sprays into both nostrils daily. 16 g 6   loratadine (CLARITIN) 10 MG tablet Take 1 tablet (10 mg total) by mouth daily. 30 tablet 11   meloxicam (MOBIC) 15 MG tablet Take 1 tablet (15 mg total) by mouth  daily as needed. 45 tablet 1   montelukast (SINGULAIR) 10 MG tablet Take 1 tablet (10 mg total) by mouth at bedtime. 90 tablet 1   norethindrone-ethinyl estradiol-FE (LOESTRIN FE) 1-20 MG-MCG tablet Take 1 tablet by mouth daily. 84 tablet 3   No facility-administered medications prior to visit.    No Known Allergies  ROS     Objective:    Physical Exam Constitutional:      General: She is not in acute distress.    Appearance: Normal appearance. She is well-developed.  HENT:     Head: Normocephalic and atraumatic.     Right Ear: External ear normal.     Left Ear: External ear normal.  Eyes:     General: No scleral icterus. Neck:     Thyroid: No thyromegaly.  Cardiovascular:     Rate and Rhythm: Normal rate and regular rhythm.     Heart sounds: Normal heart sounds. No murmur heard. Pulmonary:     Effort: Pulmonary effort is normal. No respiratory distress.     Breath sounds: Normal breath sounds. No wheezing.  Chest:  Breasts:    Right: Normal. No mass, nipple discharge, skin change or tenderness.     Left: Tenderness (lower breast) present. No mass, nipple discharge or skin change.  Musculoskeletal:     Cervical back: Neck supple.  Skin:    General: Skin is warm and dry.     Comments: Patient has firm pea sized skin inflammation left groin Has 2 over mons pubis with some fluctuance in the middle.   Neurological:     Mental Status: She is alert and oriented to person, place, and time.  Psychiatric:        Mood and Affect: Mood normal.        Behavior: Behavior normal.        Thought Content: Thought content normal.        Judgment: Judgment normal.      BP 109/66 (BP Location: Left Arm, Patient Position: Sitting)   Pulse 75   Temp 98.3 F (36.8 C) (Oral)   Resp 20   Ht 5\' 9"  (1.753 m)   Wt 211 lb (95.7 kg)   SpO2 100%   BMI 31.16 kg/m  Wt Readings from Last 3 Encounters:  10/27/22 211 lb (95.7 kg)  07/09/22 218 lb 12.8 oz (99.2 kg)  05/09/22 205 lb (93  kg)       Assessment & Plan:   Problem List Items Addressed This Visit  Unprioritized   Mastalgia    Normal breast exam- monitor.       History of positive PPD - Primary    Positive PPD in 2022 with normal chest x-ray. No treatment given at the time. -Order Quantiferon-TB Gold (blood test) to confirm exposure. -Order chest x-ray to ensure no active disease. -If blood test is positive, refer to health department for treatment.       Relevant Orders   QuantiFERON-TB Gold Plus   DG Chest 2 View   Globus sensation    Persistent sensation of something stuck in throat, possibly related to allergies and post-nasal drip. -Advise resumption of allergy medications (Claritin, Flonase, Singulair). -If symptoms persist, refer to ENT.       Folliculitis    New. Recommended warm compresses bid, rx with keflex 500mg  tid x 7 days.       Bilateral shoulder pain   Relevant Orders   Ambulatory referral to Sports Medicine    I am having Elika Egle start on cephALEXin. I am also having her maintain her norethindrone-ethinyl estradiol-FE, loratadine, fluticasone, montelukast, escitalopram, meloxicam, and fluconazole.  Meds ordered this encounter  Medications   cephALEXin (KEFLEX) 500 MG capsule    Sig: Take 1 capsule (500 mg total) by mouth 3 (three) times daily.    Dispense:  21 capsule    Refill:  0    Order Specific Question:   Supervising Provider    Answer:   Danise Edge A [[id number]]

## 2022-10-27 NOTE — Assessment & Plan Note (Signed)
Positive PPD in 2022 with normal chest x-ray. No treatment given at the time. -Order Quantiferon-TB Gold (blood test) to confirm exposure. -Order chest x-ray to ensure no active disease. -If blood test is positive, refer to health department for treatment.

## 2022-10-27 NOTE — Assessment & Plan Note (Signed)
New. Recommended warm compresses bid, rx with keflex 500mg  tid x 7 days.

## 2022-10-27 NOTE — Assessment & Plan Note (Signed)
Normal breast exam- monitor.

## 2022-10-27 NOTE — Assessment & Plan Note (Signed)
Persistent sensation of something stuck in throat, possibly related to allergies and post-nasal drip. -Advise resumption of allergy medications (Claritin, Flonase, Singulair). -If symptoms persist, refer to ENT.

## 2022-10-29 LAB — QUANTIFERON-TB GOLD PLUS
Mitogen-NIL: >10 IU/mL
NIL: 0.03 IU/mL
QuantiFERON-TB Gold Plus: NEGATIVE
TB1-NIL: 0 IU/mL
TB2-NIL: 0.03 IU/mL

## 2022-12-25 ENCOUNTER — Encounter: Payer: Self-pay | Admitting: Family

## 2023-01-09 ENCOUNTER — Ambulatory Visit (INDEPENDENT_AMBULATORY_CARE_PROVIDER_SITE_OTHER): Payer: BC Managed Care – PPO

## 2023-01-09 DIAGNOSIS — Z0100 Encounter for examination of eyes and vision without abnormal findings: Secondary | ICD-10-CM | POA: Diagnosis not present

## 2023-01-09 NOTE — Progress Notes (Signed)
Pt presents today for a vision screen for her Physical and Emotional Health Assessment form   Vision:  Both: 20/13 Left: 20/15 Right: 20/20  Corrected: No Any corrective eye surgeries: No

## 2023-01-13 ENCOUNTER — Other Ambulatory Visit: Payer: Self-pay | Admitting: Family

## 2023-01-13 MED ORDER — ESCITALOPRAM OXALATE 10 MG PO TABS: 10.0000 mg | ORAL_TABLET | Freq: Every day | ORAL | 0 refills | Status: DC

## 2023-01-14 ENCOUNTER — Encounter: Payer: Self-pay | Admitting: Family

## 2023-01-16 ENCOUNTER — Other Ambulatory Visit (HOSPITAL_COMMUNITY)
Admission: RE | Admit: 2023-01-16 | Discharge: 2023-01-16 | Disposition: A | Discharge status: Home / Self Care | Payer: BC Managed Care – PPO | Source: Ambulatory Visit | Attending: Family | Admitting: Family

## 2023-01-16 ENCOUNTER — Ambulatory Visit: Payer: BC Managed Care – PPO | Admitting: Family

## 2023-01-16 VITALS — BP 110/64 | HR 100 | Temp 98.6°F | Resp 18 | Ht 69.0 in | Wt 213.6 lb

## 2023-01-16 DIAGNOSIS — N760 Acute vaginitis: Secondary | ICD-10-CM | POA: Insufficient documentation

## 2023-01-16 DIAGNOSIS — J312 Chronic pharyngitis: Secondary | ICD-10-CM | POA: Diagnosis not present

## 2023-01-16 DIAGNOSIS — Z30431 Encounter for routine checking of intrauterine contraceptive device: Secondary | ICD-10-CM | POA: Diagnosis not present

## 2023-01-16 DIAGNOSIS — Z113 Encounter for screening for infections with a predominantly sexual mode of transmission: Secondary | ICD-10-CM | POA: Insufficient documentation

## 2023-01-16 LAB — POCT RAPID STREP A (OFFICE): Rapid Strep A Screen: NEGATIVE

## 2023-01-16 MED ORDER — FLUCONAZOLE 150 MG PO TABS: ORAL_TABLET | ORAL | 0 refills | Status: DC

## 2023-01-16 MED ORDER — AMOXICILLIN-POT CLAVULANATE 875-125 MG PO TABS: 1.0000 | ORAL_TABLET | Freq: Two times a day (BID) | ORAL | 0 refills | Status: AC

## 2023-01-16 NOTE — Progress Notes (Signed)
Elizabeth Harmon is a 22 y.o. female with the following history as recorded in EpicCare:  Patient Active Problem List   Diagnosis Date Noted   History of positive PPD 10/27/2022   Bilateral shoulder pain 10/27/2022   Mastalgia 10/27/2022   Folliculitis 10/27/2022   Juvenile idiopathic arthritis ([facility]) 05/09/2022   Dysuria 01/08/2022   Anxiety and depression 10/18/2021   Allergic rhinitis 05/01/2021   Capsulitis of left shoulder 05/01/2021   Preventative health care 10/05/2020   Globus sensation 10/05/2020   Irregular menses 04/28/2014   Migraines 04/28/2014   Morbid obesity ([facility]) 04/28/2014    Current Outpatient Medications  Medication Sig Dispense Refill   amoxicillin-clavulanate (AUGMENTIN) 875-125 MG tablet Take 1 tablet by mouth 2 (two) times daily for 10 days. 20 tablet 0   fluconazole (DIFLUCAN) 150 MG tablet Take 1 tablet as directed; repeat after 72 hours 2 tablet 0   fluticasone (FLONASE) 50 MCG/ACT nasal spray Place 2 sprays into both nostrils daily. 16 g 6   loratadine (CLARITIN) 10 MG tablet Take 1 tablet (10 mg total) by mouth daily. 30 tablet 11   No current facility-administered medications for this visit.    Allergies: Patient has no known allergies.  No past medical history on file.  Past Surgical History:  Procedure Laterality Date   EYE SURGERY     tear duct   OTHER SURGICAL HISTORY Bilateral    tear duct    TONSILLECTOMY Bilateral 04/19/2020   Procedure: TONSILLECTOMY;  Surgeon: Laren Boom, DO;  Location: [facility];  Service: ENT;  Laterality: Bilateral;    Family History  Problem Relation Age of Onset   Hyperthyroidism Mother    Diabetes Father    Hyperlipidemia Father    Hypertension Father    Cancer Maternal Grandfather        prostate   Arthritis Maternal Grandfather    Heart attack Maternal Uncle    Cancer Paternal Aunt        lung   Heart attack Paternal Uncle    Cancer Paternal Uncle        lung   Sudden death Neg  Hx     Social History   Tobacco Use   Smoking status: Never   Smokeless tobacco: Never  Substance Use Topics   Alcohol use: No    Subjective:  Patient was asked to come get a strep test by her PCP; concerned about chronic mucus in back of throat/ chronically bad breath- up to date on dental treatments; treatment plan outlined indicated that if strep test is negative, she will need to be referred to ENT; of note, did not take antibiotics that were given in July 2024;  Also concerned about possible yeast infection;    Objective:  Vitals:   01/16/23 1518  BP: 110/64  Pulse: 100  Resp: 18  Temp: 98.6 F (37 C)  TempSrc: Oral  SpO2: 100%  Weight: 213 lb 9.6 oz (96.9 kg)  Height: 5\' 9"  (1.753 m)    General: Well developed, well nourished, in no acute distress  Skin : Warm and dry.  Head: Normocephalic and atraumatic  Eyes: Sclera and conjunctiva clear; pupils round and reactive to light; extraocular movements intact  Ears: External normal; canals clear; tympanic membranes normal  Oropharynx: Pink, supple. No suspicious lesions; post nasal drainage noted Neck: Supple without thyromegaly, adenopathy  Lungs: Respirations unlabored; clear to auscultation bilaterally without wheeze, rales, rhonchi  CVS exam: normal rate and regular rhythm.  Neurologic: Alert  and oriented; speech intact; face symmetrical; moves all extremities well; CNII-XII intact without focal deficit   Assessment:  1. Chronic sore throat   2. Encounter for management of intrauterine contraceptive device (IUD), unspecified IUD management type   3. Acute vaginitis     Plan:  Rapid strep is negative; ? Underlying sinus infection- Rx for augmentin 875 mg bid x 10 days; will update referral to ENT and patient can determine if she wants appointment if symptoms persist;  Refer to GYN to discuss IUD placement; encouraged patient to use condoms/ safe sex until she can get IUD placed as she does not want to take  OCP; Vaginal swab updated; Rx for Diflucan- use as directed;   No follow-ups on file.  Orders Placed This Encounter  Procedures   Ambulatory referral to ENT    Referral Priority:   Routine    Referral Type:   Consultation    Referral Reason:   Specialty Services Required    Requested Specialty:   Otolaryngology    Number of Visits Requested:   1   Ambulatory referral to Obstetrics / Gynecology    Referral Priority:   Routine    Referral Type:   Consultation    Referral Reason:   Specialty Services Required    Requested Specialty:   Obstetrics and Gynecology    Number of Visits Requested:   1   POCT rapid strep A    Requested Prescriptions   Signed Prescriptions Disp Refills   amoxicillin-clavulanate (AUGMENTIN) 875-125 MG tablet 20 tablet 0    Sig: Take 1 tablet by mouth 2 (two) times daily for 10 days.   fluconazole (DIFLUCAN) 150 MG tablet 2 tablet 0    Sig: Take 1 tablet as directed; repeat after 72 hours

## 2023-01-19 ENCOUNTER — Encounter: Payer: Self-pay | Admitting: Family

## 2023-01-19 LAB — CERVICOVAGINAL ANCILLARY ONLY
Bacterial Vaginitis (gardnerella): NEGATIVE
Candida Glabrata: NEGATIVE
Candida Vaginitis: NEGATIVE
Chlamydia: NEGATIVE
Comment: NEGATIVE
Comment: NEGATIVE
Comment: NEGATIVE
Comment: NEGATIVE
Comment: NEGATIVE
Comment: NORMAL
Neisseria Gonorrhea: NEGATIVE
Trichomonas: NEGATIVE

## 2023-01-23 ENCOUNTER — Ambulatory Visit: Payer: BC Managed Care – PPO | Admitting: Family

## 2023-03-09 ENCOUNTER — Encounter: Payer: BC Managed Care – PPO | Admitting: Family

## 2023-03-13 ENCOUNTER — Ambulatory Visit (INDEPENDENT_AMBULATORY_CARE_PROVIDER_SITE_OTHER): Payer: BC Managed Care – PPO | Admitting: Family

## 2023-03-13 ENCOUNTER — Other Ambulatory Visit (HOSPITAL_COMMUNITY)
Admission: RE | Admit: 2023-03-13 | Discharge: 2023-03-13 | Disposition: A | Discharge status: Home / Self Care | Payer: BC Managed Care – PPO | Source: Ambulatory Visit | Attending: Family | Admitting: Family

## 2023-03-13 ENCOUNTER — Encounter: Payer: Self-pay | Admitting: Family

## 2023-03-13 VITALS — BP 119/63 | HR 95 | Temp 99.1°F | Resp 16 | Ht 69.0 in | Wt 207.0 lb

## 2023-03-13 DIAGNOSIS — M25512 Pain in left shoulder: Secondary | ICD-10-CM

## 2023-03-13 DIAGNOSIS — R739 Hyperglycemia, unspecified: Secondary | ICD-10-CM

## 2023-03-13 DIAGNOSIS — R09A2 Foreign body sensation, throat: Secondary | ICD-10-CM

## 2023-03-13 DIAGNOSIS — G8929 Other chronic pain: Secondary | ICD-10-CM

## 2023-03-13 DIAGNOSIS — M25511 Pain in right shoulder: Secondary | ICD-10-CM

## 2023-03-13 DIAGNOSIS — N760 Acute vaginitis: Secondary | ICD-10-CM

## 2023-03-13 DIAGNOSIS — Z0000 Encounter for general adult medical examination without abnormal findings: Secondary | ICD-10-CM | POA: Diagnosis not present

## 2023-03-13 DIAGNOSIS — F32A Depression, unspecified: Secondary | ICD-10-CM

## 2023-03-13 DIAGNOSIS — F419 Anxiety disorder, unspecified: Secondary | ICD-10-CM

## 2023-03-13 DIAGNOSIS — K219 Gastro-esophageal reflux disease without esophagitis: Secondary | ICD-10-CM | POA: Diagnosis not present

## 2023-03-13 NOTE — Assessment & Plan Note (Signed)
  Reports of increased discharge, particularly after menstruation. Some itching reported. -Perform vaginal swab to test for yeast or bacterial infection/STD. -Consider treatment with metronidazole depending on test results.

## 2023-03-13 NOTE — Assessment & Plan Note (Signed)
No concern with depression.  Reports of social anxiety and general anxiety related to feeling out of control. No interest in medication at this time. -Consider exercise, meditation, and counseling as management strategies. -Consider medication if symptoms worsen.

## 2023-03-13 NOTE — Progress Notes (Signed)
Subjective:     Patient ID: Elizabeth Harmon, female    DOB: 22-Jun-2000, 22 y.o.   MRN: [id number]  Chief Complaint  Patient presents with   Annual Exam    HPI  Discussed the use of AI scribe software for clinical note transcription with the patient, who gave verbal consent to proceed.  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   She reports persistent bilateral shoulder pain. She was previously evaluated by sports medicine and underwent an ultrasound that revealed excess fluid and an extra joint. However, further evaluation with MRI was denied by her insurance. all of her autoimmune studies were WNL.   Elizabeth Harmon also expresses concern about her weight. She has a history of significant weight loss, from 267 lbs to 170 lbs, achieved primarily through dietary changes. However, she has recently experienced some weight regain and is finding it difficult to lose weight again despite resuming her previous diet. She is currently exercising 4-5 times a week.   Furthermore, she reports recurrent yeast infections, particularly following her menstrual period. She has been self-treating with boric acid and over-the-counter medications. She also mentions a significant amount of vaginal discharge, odor and occasional itching.  Lastly, she acknowledges experiencing anxiety, particularly social anxiety. She describes episodes of feeling extremely nervous, sweaty, and on the verge of a breakdown. She does not currently have any strategies for managing her anxiety and has not sought any medical treatment for it.     Immunizations: flu shot, declines covid vaccine Diet: needs improvement  Wt Readings from Last 3 Encounters:  03/13/23 207 lb (93.9 kg)  01/16/23 213 lb 9.6 oz (96.9 kg)  10/27/22 211 lb (95.7 kg)  Exercise: some gym time  Pap Smear: due 11/26 Vision/Dental: up to date    Health Maintenance Due   Topic Date Due   COVID-19 Vaccine (1 - 2023-24 season) Never done    No past medical history on file.  Past Surgical History:  Procedure Laterality Date   EYE SURGERY     tear duct   OTHER SURGICAL HISTORY Bilateral    tear duct    TONSILLECTOMY Bilateral 04/19/2020   Procedure: TONSILLECTOMY;  Surgeon: Laren Boom, DO;  Location:  [facility];  Service: ENT;  Laterality: Bilateral;    Family History  Problem Relation Age of Onset   Hyperthyroidism Mother    Diabetes Father    Hyperlipidemia Father    Hypertension Father    Cancer Maternal Grandfather        prostate   Arthritis Maternal Grandfather    Heart attack Maternal Uncle    Cancer Paternal Aunt        lung   Heart attack Paternal Uncle    Cancer Paternal Uncle        lung   Sudden death Neg Hx     Social History   Socioeconomic History   Marital status: Single    Spouse name: Not on file   Number of children: Not on file   Years of education: Not on file   Highest education level: Bachelor's degree (e.g., BA, AB, BS)  Occupational History   Not on file  Tobacco Use   Smoking status: Never   Smokeless tobacco: Never  Vaping Use   Vaping status: Never Used  Substance and Sexual Activity   Alcohol use: No   Drug use: No   Sexual activity:  Not on file  Other Topics Concern   Not on file  Social History Narrative   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[employer]   Enjoys television, basketball, reading   Does well in school         Social Determinants of Health   Financial Resource Strain: Low Risk  (01/16/2023)   Overall Financial Resource Strain (CARDIA)    Difficulty of Paying Living Expenses: Not very hard  Food Insecurity: Low Risk  (03/13/2023)   Received from [facility]   Hunger Vital Sign    Worried About Running Out of Food in the Last Year: Never true    Ran Out of Food in the Last Year: Never true  Transportation Needs: No Transportation Needs (03/13/2023)   Received  from [facility]    In the past 12 months, has lack of reliable transportation kept you from medical appointments, meetings, work or from getting things needed for daily living? : No  Physical Activity: Insufficiently Active (01/16/2023)   Exercise Vital Sign    Days of Exercise per Week: 4 days    Minutes of Exercise per Session: 30 min  Stress: Stress Concern Present (01/16/2023)   [employer] of Occupational Health - Occupational Stress Questionnaire    Feeling of Stress : To some extent  Social Connections: Moderately Integrated (01/16/2023)   Social Connection and Isolation Panel [NHANES]    Frequency of Communication with Friends and Family: More than three times a week    Frequency of Social Gatherings with Friends and Family: Once a week    Attends Religious Services: More than 4 times per year    Active Member of [employer] or Organizations: Yes    Attends Banker Meetings: 1 to 4 times per year    Marital Status: Never married  Catering manager Violence: Not on file    Outpatient Medications Prior to Visit  Medication Sig Dispense Refill   fluconazole (DIFLUCAN) 150 MG tablet Take 1 tablet as directed; repeat after 72 hours 2 tablet 0   fluticasone (FLONASE) 50 MCG/ACT nasal spray Place 2 sprays into both nostrils daily. 16 g 6   loratadine (CLARITIN) 10 MG tablet Take 1 tablet (10 mg total) by mouth daily. 30 tablet 11   No facility-administered medications prior to visit.    No Known Allergies  Review of Systems  Constitutional:  Positive for weight loss.  HENT:  Negative for congestion and hearing loss.   Eyes:  Negative for blurred vision.  Respiratory:  Negative for cough.   Cardiovascular:  Negative for leg swelling.  Gastrointestinal:  Positive for heartburn. Negative for constipation and diarrhea.  Genitourinary:  Negative for dysuria and frequency.       Periods regular  Musculoskeletal:        Bilateral joint pain  Skin:   Negative for rash.  Neurological:  Negative for headaches.  Psychiatric/Behavioral:         Denies depression Some anxiety       Objective:    Physical Exam   BP 119/63 (BP Location: Right Arm, Patient Position: Sitting, Cuff Size: Normal)   Pulse 95   Temp 99.1 F (37.3 C) (Oral)   Resp 16   Ht 5\' 9"  (1.753 m)   Wt 207 lb (93.9 kg)   SpO2 100%   BMI 30.57 kg/m  Wt Readings from Last 3 Encounters:  03/13/23 207 lb (93.9 kg)  01/16/23 213 lb 9.6 oz (96.9 kg)  10/27/22 211 lb (  95.7 kg)   Physical Exam  Constitutional: She is oriented to person, place, and time. She appears well-developed and well-nourished. No distress.  HENT:  Head: Normocephalic and atraumatic.  Right Ear: Tympanic membrane and ear canal normal.  Left Ear: Tympanic membrane and ear canal normal.  Mouth/Throat: Oropharynx is clear and moist.  Eyes: Pupils are equal, round, and reactive to light. No scleral icterus.  Neck: Normal range of motion. No thyromegaly present.  Cardiovascular: Normal rate and regular rhythm.   No murmur heard. Pulmonary/Chest: Effort normal and breath sounds normal. No respiratory distress. He has no wheezes. She has no rales. She exhibits no tenderness.  Abdominal: Soft. Bowel sounds are normal. She exhibits no distension and no mass. There is no tenderness. There is no rebound and no guarding.  Musculoskeletal: She exhibits no edema.  Lymphadenopathy:    She has no cervical adenopathy.  Neurological: She is alert and oriented to person, place, and time. She has 1+ patellar reflexes. She exhibits normal muscle tone. Coordination normal.  Skin: Skin is warm and dry.  Psychiatric: She has a normal mood and affect. Her behavior is normal. Judgment and thought content normal.  Breasts: Deferred Inguinal/mons: Normal without inguinal adenopathy  External genitalia: Normal  BUS/Urethra/Skene's glands: Normal  Bladder: Normal  Vagina: Normal- some thin white discharge noted at  introitus.          Assessment & Plan:       Assessment & Plan:   Problem List Items Addressed This Visit       Unprioritized   Preventative health care - Primary     Weight Management Reports of weight gain after a period of weight loss. Currently back on previous diet but struggling with a plateau. -Reduce daily caloric intake to 1500 calories. -Continue current exercise regimen. -Consider keeping a calorie count for a week.       Globus sensation     Persistent sensation of something stuck in the throat. ENT consultation resulted in a prescription for omeprazole twice daily for two months. -Start omeprazole as prescribed by ENT.       Bilateral shoulder pain     Persistent despite ultrasound showing excess fluid and an extra joint. MRI denied by insurance. Pain now also in the right shoulder and hip. Autoimmune testing was normal. -Continue current management and follow-up with orthopedics.       Anxiety and depression    No concern with depression.  Reports of social anxiety and general anxiety related to feeling out of control. No interest in medication at this time. -Consider exercise, meditation, and counseling as management strategies. -Consider medication if symptoms worsen.      Acute vaginitis     Reports of increased discharge, particularly after menstruation. Some itching reported. -Perform vaginal swab to test for yeast or bacterial infection/STD. -Consider treatment with metronidazole depending on test results.      Relevant Orders   Cervicovaginal ancillary only( [place])   Other Visit Diagnoses     Hyperglycemia       Relevant Orders   HgB A1c       I am having Elizabeth Harmon maintain her loratadine, fluticasone, and fluconazole.  No orders of the defined types were placed in this encounter.

## 2023-03-13 NOTE — Assessment & Plan Note (Signed)
  Persistent sensation of something stuck in the throat. ENT consultation resulted in a prescription for omeprazole twice daily for two months. -Start omeprazole as prescribed by ENT.

## 2023-03-13 NOTE — Assessment & Plan Note (Signed)
  Persistent despite ultrasound showing excess fluid and an extra joint. MRI denied by insurance. Pain now also in the right shoulder and hip. Autoimmune testing was normal. -Continue current management and follow-up with orthopedics.

## 2023-03-13 NOTE — Assessment & Plan Note (Signed)
  Weight Management Reports of weight gain after a period of weight loss. Currently back on previous diet but struggling with a plateau. -Reduce daily caloric intake to 1500 calories. -Continue current exercise regimen. -Consider keeping a calorie count for a week.

## 2023-03-13 NOTE — Patient Instructions (Signed)
VISIT SUMMARY:  Elizabeth Harmon, during today's visit, we discussed several ongoing health concerns including chest tightness, shoulder pain, weight management, vaginal discharge, and anxiety. We reviewed your current symptoms and made adjustments to your treatment plan where necessary.  YOUR PLAN:   -GASTROESOPHAGEAL REFLUX DISEASE (GERD): GERD is a condition where stomach acid frequently flows back into the tube connecting your mouth and stomach, causing irritation. You will start taking omeprazole twice daily for two months as prescribed by the ENT specialist.  -SHOULDER PAIN: Your shoulder pain persists and has now spread to your right shoulder and hip. Despite normal autoimmune tests and an ultrasound showing excess fluid and an extra joint, further evaluation with MRI was denied by your insurance. We will continue with your current management and follow up with orthopedics.  -WEIGHT MANAGEMENT: You have experienced some weight regain after significant weight loss. To help manage your weight, reduce your daily caloric intake to 1500 calories, continue your current exercise routine, and consider keeping a calorie count for a week.  -VAGINAL DISCHARGE: You have reported increased vaginal discharge and some itching, particularly after your menstrual period. We will perform a vaginal swab to test for yeast or bacterial infection and consider treatment with metronidazole based on the results.  -ANXIETY: You are experiencing social anxiety and general anxiety without current management strategies. Consider incorporating exercise, meditation, and counseling into your routine. Medication can be considered if your symptoms worsen.  -GENERAL HEALTH MAINTENANCE: Continue with your annual Pap smear, which is next due on 11/26. Consider getting an A1c test to monitor for diabetes. You have already received your flu shot. Please consider getting a COVID-19 vaccine at your pharmacy.   INSTRUCTIONS:  Please follow  up with orthopedics for your shoulder pain. Start taking omeprazole as prescribed for GERD. Perform a vaginal swab to test for infection and follow up based on the results. Consider keeping a calorie count for a week to help with weight management. If your anxiety symptoms worsen, consider discussing medication options.

## 2023-03-14 LAB — HEMOGLOBIN A1C
Hgb A1c MFr Bld: 5.2 %{Hb} (ref <5.7)
Mean Plasma Glucose: 103 mg/dL
eAG (mmol/L): 5.7 mmol/L

## 2023-03-16 LAB — CERVICOVAGINAL ANCILLARY ONLY
Bacterial Vaginitis (gardnerella): NEGATIVE
Candida Glabrata: NEGATIVE
Candida Vaginitis: NEGATIVE
Chlamydia: NEGATIVE
Comment: NEGATIVE
Comment: NEGATIVE
Comment: NEGATIVE
Comment: NEGATIVE
Comment: NEGATIVE
Comment: NORMAL
Neisseria Gonorrhea: NEGATIVE
Trichomonas: NEGATIVE

## 2023-04-10 ENCOUNTER — Ambulatory Visit: Payer: BC Managed Care – PPO | Admitting: Physician Assistant

## 2023-04-10 ENCOUNTER — Ambulatory Visit: Payer: BC Managed Care – PPO | Admitting: Family

## 2023-04-10 ENCOUNTER — Other Ambulatory Visit (HOSPITAL_COMMUNITY)
Admission: RE | Admit: 2023-04-10 | Discharge: 2023-04-10 | Disposition: A | Discharge status: Home / Self Care | Payer: BC Managed Care – PPO | Source: Ambulatory Visit | Attending: Physician Assistant | Admitting: Physician Assistant

## 2023-04-10 VITALS — BP 127/74 | HR 80 | Temp 98.7°F | Ht 69.0 in | Wt 200.5 lb

## 2023-04-10 DIAGNOSIS — N760 Acute vaginitis: Secondary | ICD-10-CM | POA: Insufficient documentation

## 2023-04-10 DIAGNOSIS — B9689 Other specified bacterial agents as the cause of diseases classified elsewhere: Secondary | ICD-10-CM | POA: Insufficient documentation

## 2023-04-10 DIAGNOSIS — Z113 Encounter for screening for infections with a predominantly sexual mode of transmission: Secondary | ICD-10-CM | POA: Diagnosis not present

## 2023-04-10 NOTE — Progress Notes (Signed)
      Established patient visit   Patient: Elizabeth Harmon   DOB: Mar 25, 2001   22 y.o. Female  MRN: [id number] Visit Date: 04/10/2023  Today's healthcare provider: Alfredia Ferguson, PA-C   Chief Complaint  Patient presents with   STD testing    Just wanting to get a routine.   Subjective     Pt wanting STI testing, she was unfortunately cheated on around a month ago. Denies any symptoms, denies vaginal discharge, dysuria, vaginal pain, itching.  Medications: Outpatient Medications Prior to Visit  Medication Sig   fluticasone (FLONASE) 50 MCG/ACT nasal spray Place 2 sprays into both nostrils daily.   loratadine (CLARITIN) 10 MG tablet Take 1 tablet (10 mg total) by mouth daily.   omeprazole (PRILOSEC) 40 MG capsule Take 40 mg by mouth 2 (two) times daily.   [DISCONTINUED] fluconazole (DIFLUCAN) 150 MG tablet Take 1 tablet as directed; repeat after 72 hours   No facility-administered medications prior to visit.    Review of Systems  Constitutional:  Negative for fatigue and fever.  Respiratory:  Negative for cough and shortness of breath.   Cardiovascular:  Negative for chest pain and leg swelling.  Gastrointestinal:  Negative for abdominal pain.  Neurological:  Negative for dizziness and headaches.       Objective    BP 127/74   Pulse 80   Temp 98.7 F (37.1 C) (Oral)   Ht 5\' 9"  (1.753 m)   Wt 200 lb 8 oz (90.9 kg)   LMP 03/17/2023 (Exact Date)   SpO2 100%   BMI 29.61 kg/m    Physical Exam Vitals reviewed.  Constitutional:      Appearance: She is not ill-appearing.  HENT:     Head: Normocephalic.  Eyes:     Conjunctiva/sclera: Conjunctivae normal.  Cardiovascular:     Rate and Rhythm: Normal rate.  Pulmonary:     Effort: Pulmonary effort is normal. No respiratory distress.  Neurological:     General: No focal deficit present.     Mental Status: She is alert and oriented to person, place, and time.  Psychiatric:        Mood and Affect: Mood normal.         Behavior: Behavior normal.      No results found for any visits on 04/10/23.  Assessment & Plan    Screening examination for STI -     Cervicovaginal ancillary only -     HIV Antibody (routine testing w rflx) -     RPR -     Hepatitis C antibody -     HSV 2 antibody, IgG   Pt comfortable w/ self swab.  Pt interested in hsv testing -- she is asymptomatic. Advised prevalence of positive ab with no history of flare in the population. Pt is understanding will still proceed with testing  Return if symptoms worsen or fail to improve.       Alfredia Ferguson, PA-C  [facility] [phone] (phone) [phone] (fax)  [facility]

## 2023-04-12 LAB — HSV 2 ANTIBODY, IGG: HSV 2 Glycoprotein G Ab, IgG: <0.9 {index}

## 2023-04-12 LAB — HEPATITIS C ANTIBODY: Hepatitis C Ab: NONREACTIVE

## 2023-04-12 LAB — HIV ANTIBODY (ROUTINE TESTING W REFLEX): HIV 1&2 Ab, 4th Generation: NONREACTIVE

## 2023-04-12 LAB — RPR: RPR Ser Ql: NONREACTIVE

## 2023-04-14 ENCOUNTER — Other Ambulatory Visit: Payer: Self-pay | Admitting: Family

## 2023-04-14 ENCOUNTER — Encounter: Payer: Self-pay | Admitting: Family

## 2023-04-14 LAB — CERVICOVAGINAL ANCILLARY ONLY
Bacterial Vaginitis (gardnerella): NEGATIVE
Candida Glabrata: NEGATIVE
Candida Vaginitis: POSITIVE — AB
Chlamydia: NEGATIVE
Comment: NEGATIVE
Comment: NEGATIVE
Comment: NEGATIVE
Comment: NEGATIVE
Comment: NEGATIVE
Comment: NORMAL
Neisseria Gonorrhea: NEGATIVE
Trichomonas: NEGATIVE

## 2023-04-14 MED ORDER — METRONIDAZOLE 500 MG PO TABS: 500.0000 mg | ORAL_TABLET | Freq: Two times a day (BID) | ORAL | 0 refills | Status: DC

## 2023-04-16 MED ORDER — METRONIDAZOLE 500 MG PO TABS: 500.0000 mg | ORAL_TABLET | Freq: Two times a day (BID) | ORAL | 0 refills | Status: AC

## 2023-05-26 DIAGNOSIS — S99911A Unspecified injury of right ankle, initial encounter: Secondary | ICD-10-CM | POA: Diagnosis not present

## 2023-05-26 DIAGNOSIS — M7989 Other specified soft tissue disorders: Secondary | ICD-10-CM | POA: Diagnosis not present

## 2023-05-26 DIAGNOSIS — M25571 Pain in right ankle and joints of right foot: Secondary | ICD-10-CM | POA: Diagnosis not present

## 2023-05-26 DIAGNOSIS — X501XXA Overexertion from prolonged static or awkward postures, initial encounter: Secondary | ICD-10-CM | POA: Diagnosis not present

## 2023-05-26 DIAGNOSIS — M25561 Pain in right knee: Secondary | ICD-10-CM | POA: Diagnosis not present

## 2023-05-27 ENCOUNTER — Telehealth: Payer: Self-pay | Admitting: *Deleted

## 2023-05-27 NOTE — Transitions of Care (Post Inpatient/ED Visit) (Signed)
   05/27/2023  Name: Datra Clary MRN: [id number] DOB: August 11, 2000  Today's TOC FU Call Status: Today's TOC FU Call Status:: Unsuccessful Call (1st Attempt) Unsuccessful Call (1st Attempt) Date: 05/27/23  Attempted to reach the patient regarding the most recent Inpatient/ED visit.  Follow Up Plan: Additional outreach attempts will be made to reach the patient to complete the Transitions of Care (Post Inpatient/ED visit) call.   Signature Kacyn Souder, [facility]

## 2023-05-28 ENCOUNTER — Telehealth: Payer: Self-pay | Admitting: *Deleted

## 2023-05-28 NOTE — Transitions of Care (Post Inpatient/ED Visit) (Signed)
   05/28/2023  Name: Michi Herrmann MRN: [id number] DOB: February 03, 2001  Today's TOC FU Call Status: Today's TOC FU Call Status:: Unsuccessful Call (2nd Attempt) Unsuccessful Call (2nd Attempt) Date: 05/28/23  Attempted to reach the patient regarding the most recent Inpatient/ED visit.  Follow Up Plan: No further outreach attempts will be made at this time. We have been unable to contact the patient.  Signature Heath Badon, [facility]

## 2023-07-01 ENCOUNTER — Telehealth: Payer: Self-pay

## 2023-07-01 NOTE — Telephone Encounter (Signed)
 Called to schedule new patient appointment in our office. Spoke with patient and she would like to schedule but was not near her work schedule. Patient stated that she will call back and schedule once she has her work schedule in front of her.

## 2023-07-22 ENCOUNTER — Encounter (INDEPENDENT_AMBULATORY_CARE_PROVIDER_SITE_OTHER): Payer: Self-pay | Admitting: Family

## 2023-07-22 DIAGNOSIS — J309 Allergic rhinitis, unspecified: Secondary | ICD-10-CM

## 2023-07-22 DIAGNOSIS — J329 Chronic sinusitis, unspecified: Secondary | ICD-10-CM

## 2023-07-22 MED ORDER — AMOXICILLIN-POT CLAVULANATE 875-125 MG PO TABS: 1.0000 | ORAL_TABLET | Freq: Two times a day (BID) | ORAL | 0 refills | Status: DC

## 2023-07-22 MED ORDER — MONTELUKAST SODIUM 10 MG PO TABS: 10.0000 mg | ORAL_TABLET | Freq: Every day | ORAL | 3 refills | Status: AC

## 2023-07-22 NOTE — Telephone Encounter (Signed)

## 2023-08-18 DIAGNOSIS — R109 Unspecified abdominal pain: Secondary | ICD-10-CM | POA: Diagnosis not present

## 2023-08-18 DIAGNOSIS — R112 Nausea with vomiting, unspecified: Secondary | ICD-10-CM | POA: Diagnosis not present

## 2023-08-18 DIAGNOSIS — R197 Diarrhea, unspecified: Secondary | ICD-10-CM | POA: Diagnosis not present

## 2023-08-18 DIAGNOSIS — R0789 Other chest pain: Secondary | ICD-10-CM | POA: Diagnosis not present

## 2023-08-18 DIAGNOSIS — Z0389 Encounter for observation for other suspected diseases and conditions ruled out: Secondary | ICD-10-CM | POA: Diagnosis not present

## 2023-08-18 LAB — LAB REPORT - SCANNED: EGFR (African American): >90

## 2023-09-11 ENCOUNTER — Ambulatory Visit: Payer: BC Managed Care – PPO | Admitting: Family

## 2023-09-22 ENCOUNTER — Ambulatory Visit: Admitting: Family

## 2023-11-06 ENCOUNTER — Other Ambulatory Visit (HOSPITAL_COMMUNITY)
Admission: RE | Admit: 2023-11-06 | Discharge: 2023-11-06 | Disposition: A | Discharge status: Home / Self Care | Source: Ambulatory Visit | Attending: Family | Admitting: Family

## 2023-11-06 ENCOUNTER — Ambulatory Visit: Admitting: Family

## 2023-11-06 VITALS — BP 115/65 | HR 61 | Temp 98.9°F | Resp 17 | Ht 69.0 in | Wt 181.8 lb

## 2023-11-06 DIAGNOSIS — Z113 Encounter for screening for infections with a predominantly sexual mode of transmission: Secondary | ICD-10-CM | POA: Insufficient documentation

## 2023-11-06 DIAGNOSIS — R0602 Shortness of breath: Secondary | ICD-10-CM

## 2023-11-06 DIAGNOSIS — R55 Syncope and collapse: Secondary | ICD-10-CM

## 2023-11-06 DIAGNOSIS — R09A2 Foreign body sensation, throat: Secondary | ICD-10-CM | POA: Diagnosis not present

## 2023-11-06 DIAGNOSIS — J309 Allergic rhinitis, unspecified: Secondary | ICD-10-CM

## 2023-11-06 NOTE — Progress Notes (Signed)
 Subjective:     Patient ID: Elizabeth Harmon, female    DOB: 12/23/2000, 23 y.o.   MRN: [id number]  Chief Complaint  Patient presents with   seasonal allergies    Says she is dealing with post nasal drip Can't cough up anything  Wants sti testing as well    HPI  Discussed the use of AI scribe software for clinical note transcription with the patient, who gave verbal consent to proceed.  History of Present Illness Elizabeth Harmon is a 23 year old female who presents with persistent throat discomfort and respiratory symptoms.  She experiences a sensation of something being stuck in her throat with an unpleasant smell, despite maintaining frequent oral hygiene. There is difficulty in expectorating thick, yellow-brown mucus. She experiences shortness of breath and chest heaviness even at rest, with a need to 'catch her breath' while sitting. Lightheadedness has worsened, initially occurring with positional changes and now includes episodes of vision going black, requiring her to hold onto something to prevent falling. Medications such as Claritin , Singulair , Flonase , and omeprazole  have not provided relief. Augmentin  was ineffective for a suspected sinus infection.      Health Maintenance Due  Topic Date Due   Meningococcal B Vaccine (1 of 2 - Standard) Never done   COVID-19 Vaccine (1 - 2024-25 season) Never done    No past medical history on file.  Past Surgical History:  Procedure Laterality Date   EYE SURGERY     tear duct   OTHER SURGICAL HISTORY Bilateral    tear duct    TONSILLECTOMY Bilateral 04/19/2020   Procedure: TONSILLECTOMY;  Surgeon: Llewellyn Gerard LABOR, DO;  Location: [facility];  Service: ENT;  Laterality: Bilateral;    Family History  Problem Relation Age of Onset   Hyperthyroidism Mother    Diabetes Father    Hyperlipidemia Father    Hypertension Father    Cancer Maternal Grandfather        prostate   Arthritis Maternal Grandfather     Heart attack Maternal Uncle    Cancer Paternal Aunt        lung   Heart attack Paternal Uncle    Cancer Paternal Uncle        lung   Sudden death Neg Hx     Social History   Socioeconomic History   Marital status: Single    Spouse name: Not on file   Number of children: Not on file   Years of education: Not on file   Highest education level: Bachelor's degree (e.g., BA, AB, BS)  Occupational History   Not on file  Tobacco Use   Smoking status: Never   Smokeless tobacco: Never  Vaping Use   Vaping status: Never Used  Substance and Sexual Activity   Alcohol use: No   Drug use: No   Sexual activity: Not on file  Other Topics Concern   Not on file  Social History Narrative   [employer]   Enjoys television, basketball, reading   Does well in school         Social Drivers of Health   Financial Resource Strain: Medium Risk (11/06/2023)   Overall Financial Resource Strain (CARDIA)    Difficulty of Paying Living Expenses: Somewhat hard  Food Insecurity: No Food Insecurity (11/06/2023)   Hunger Vital Sign    Worried About Running Out of Food in the Last Year: Never true    Ran Out of Food in the Last  Year: Never true  Transportation Needs: No Transportation Needs (11/06/2023)   PRAPARE - Administrator, Civil Service (Medical): No    Lack of Transportation (Non-Medical): No  Physical Activity: Sufficiently Active (11/06/2023)   Exercise Vital Sign    Days of Exercise per Week: 5 days    Minutes of Exercise per Session: 60 min  Stress: Stress Concern Present (11/06/2023)   [employer] of Occupational Health - Occupational Stress Questionnaire    Feeling of Stress: To some extent  Social Connections: Moderately Integrated (11/06/2023)   Social Connection and Isolation Panel    Frequency of Communication with Friends and Family: More than three times a week    Frequency of Social Gatherings with Friends and Family: Once a week     Attends Religious Services: More than 4 times per year    Active Member of [employer] or Organizations: Yes    Attends Banker Meetings: 1 to 4 times per year    Marital Status: Never married  Intimate Partner Violence: Not on file    Outpatient Medications Prior to Visit  Medication Sig Dispense Refill   fluticasone  (FLONASE ) 50 MCG/ACT nasal spray Place 2 sprays into both nostrils daily. 16 g 6   loratadine  (CLARITIN ) 10 MG tablet Take 1 tablet (10 mg total) by mouth daily. 30 tablet 11   montelukast  (SINGULAIR ) 10 MG tablet Take 1 tablet (10 mg total) by mouth at bedtime. 30 tablet 3   omeprazole  (PRILOSEC) 40 MG capsule Take 40 mg by mouth 2 (two) times daily.     amoxicillin -clavulanate (AUGMENTIN ) 875-125 MG tablet Take 1 tablet by mouth 2 (two) times daily. (Patient not taking: Reported on 11/06/2023) 20 tablet 0   No facility-administered medications prior to visit.    No Known Allergies  ROS    See HPI   Objective:    Physical Exam Exam conducted with a chaperone present.  Constitutional:      General: She is not in acute distress.    Appearance: Normal appearance. She is well-developed.  HENT:     Head: Normocephalic and atraumatic.     Right Ear: Tympanic membrane, ear canal and external ear normal.     Left Ear: Tympanic membrane, ear canal and external ear normal.     Mouth/Throat:     Mouth: Mucous membranes are moist.     Pharynx: No pharyngeal swelling or posterior oropharyngeal erythema.  Eyes:     General: No scleral icterus. Neck:     Thyroid : No thyromegaly.  Cardiovascular:     Rate and Rhythm: Normal rate and regular rhythm.     Heart sounds: Normal heart sounds. No murmur heard. Pulmonary:     Effort: Pulmonary effort is normal. No respiratory distress.     Breath sounds: Normal breath sounds. No wheezing.  Genitourinary:    Exam position: Lithotomy position.     Labia:        Right: No rash.        Left: No rash.   Musculoskeletal:      Cervical back: Neck supple.  Skin:    General: Skin is warm and dry.     Comments: Pea sized right inguinal lymph node- mobile and non-tender  Inflamed follicle right labia.  Small skin tag like lesion left inner thigh  Neurological:     Mental Status: She is alert and oriented to person, place, and time.  Psychiatric:        Mood and Affect:  Mood normal.        Behavior: Behavior normal.        Thought Content: Thought content normal.        Judgment: Judgment normal.      BP 115/65 (BP Location: Left Arm, Patient Position: Sitting, Cuff Size: Normal)   Pulse 61   Temp 98.9 F (37.2 C) (Oral)   Resp 17   Ht 5' 9 (1.753 m)   Wt 181 lb 12.8 oz (82.5 kg)   LMP 10/23/2023 (Exact Date)   SpO2 100%   BMI 26.85 kg/m  Wt Readings from Last 3 Encounters:  11/06/23 181 lb 12.8 oz (82.5 kg)  04/10/23 200 lb 8 oz (90.9 kg)  03/13/23 207 lb (93.9 kg)       Assessment & Plan:   Problem List Items Addressed This Visit       Unprioritized   SOB (shortness of breath)   EKG tracing is personally reviewed.  EKG notes NSR- sinus brady.  No acute changes.   I wonder if her symptoms may be asthma related. Will refer to allergist for further evaluation.        Relevant Orders   Ambulatory referral to Allergy    Near syncope - Primary   Lab work unremarkable.  EKG notes sinus brady- doubt bradycardia is contributor at 58 bpm. No significant orthostasis.  Encourage hydration. Monitor.       Relevant Orders   Comp Met (CMET) (Completed)   CBC w/Diff (Completed)   TSH (Completed)   EKG 12-Lead (Completed)   Globus sensation   Did not improve with PPI.  Will refer to ENT for further evaluation.      Relevant Orders   Ambulatory referral to ENT   Allergic rhinitis   No significant improvement with flonase  singulair  or antihistamines.  Will refer to allergist.       Relevant Orders   Ambulatory referral to Allergy    Other Visit Diagnoses       Routine screening for  STI (sexually transmitted infection)       Relevant Orders   Cervicovaginal ancillary only( [place])   RPR   HIV antibody (with reflex) (Completed)     50 minutes spent on today's visit. Time was spent reviewing medical record, interviewing/examining patient, reviewing EKG and formulating complex medical plan.   I have discontinued Ennis Wilds's omeprazole  and amoxicillin -clavulanate. I am also having her maintain her loratadine , fluticasone , and montelukast .  No orders of the defined types were placed in this encounter.

## 2023-11-07 ENCOUNTER — Ambulatory Visit: Payer: Self-pay | Admitting: Family

## 2023-11-07 ENCOUNTER — Telehealth: Payer: Self-pay | Admitting: Family

## 2023-11-07 DIAGNOSIS — R0602 Shortness of breath: Secondary | ICD-10-CM | POA: Insufficient documentation

## 2023-11-07 DIAGNOSIS — R55 Syncope and collapse: Secondary | ICD-10-CM | POA: Insufficient documentation

## 2023-11-07 NOTE — Assessment & Plan Note (Signed)
 Lab work unremarkable.  EKG notes sinus brady- doubt bradycardia is contributor at 58 bpm. No significant orthostasis.  Encourage hydration. Monitor.

## 2023-11-07 NOTE — Assessment & Plan Note (Signed)
 EKG tracing is personally reviewed.  EKG notes NSR- sinus brady.  No acute changes.   I wonder if her symptoms may be asthma related. Will refer to allergist for further evaluation.

## 2023-11-07 NOTE — Telephone Encounter (Signed)
 Her bilirubin (one of her liver tests) is up a little.  His can be normal for some people, but I would like to repeat in 1 month.

## 2023-11-07 NOTE — Telephone Encounter (Signed)
 See mychart.

## 2023-11-07 NOTE — Patient Instructions (Signed)
 VISIT SUMMARY:  During your visit, we discussed your persistent throat discomfort, respiratory symptoms, lightheadedness, and skin lesions. We also addressed your request for STI screening.  YOUR PLAN:  CHRONIC ALLERGIC RHINITIS: You have a persistent sensation of something stuck in your throat, an unpleasant smell, and thick mucus, which suggest chronic allergic rhinitis. -We will order lab work to check your thyroid  function, electrolytes, and complete blood count (CBC). -If your current medications remain ineffective, we will consider alternative treatments.  DYSPNEA AND CHEST HEAVINESS: You experience shortness of breath and chest heaviness even at rest. -We will perform an EKG to rule out any cardiac issues.  LIGHTHEADEDNESS AND PRESYNCOPE: You have chronic lightheadedness and episodes of nearly fainting, especially with positional changes. -We will perform orthostatic blood pressure checks. -We will order lab work to check your thyroid  function, electrolytes, and complete blood count (CBC).  SKIN LESIONS: You have small skin lesions possibly related to laser hair removal. -We will monitor the skin lesions for any changes. -If the lesions enlarge, we may consider cryotherapy.  SEXUALLY TRANSMITTED INFECTION SCREENING: You requested screening for sexually transmitted infections (STIs). -We will perform a vaginal swab to test for gonorrhea, chlamydia, and trichomonas. -We will order lab work to screen for HIV and syphilis.

## 2023-11-07 NOTE — Assessment & Plan Note (Signed)
 Did not improve with PPI.  Will refer to ENT for further evaluation.

## 2023-11-07 NOTE — Assessment & Plan Note (Signed)
 No significant improvement with flonase  singulair  or antihistamines.  Will refer to allergist.

## 2023-11-08 LAB — COMPREHENSIVE METABOLIC PANEL WITH GFR
AG Ratio: 1.7 (calc) (ref 1.0–2.5)
ALT: 12 U/L (ref 6–29)
AST: 32 U/L — ABNORMAL HIGH (ref 10–30)
Albumin: 4.4 g/dL (ref 3.6–5.1)
Alkaline phosphatase (APISO): 40 U/L (ref 31–125)
BUN: 11 mg/dL (ref 7–25)
CO2: 23 mmol/L (ref 20–32)
Calcium: 9.5 mg/dL (ref 8.6–10.2)
Chloride: 104 mmol/L (ref 98–110)
Creat: 0.9 mg/dL (ref 0.50–0.96)
Globulin: 2.6 g/dL (ref 1.9–3.7)
Glucose, Bld: 84 mg/dL (ref 65–99)
Potassium: 4.2 mmol/L (ref 3.5–5.3)
Sodium: 137 mmol/L (ref 135–146)
Total Bilirubin: 1.5 mg/dL — ABNORMAL HIGH (ref 0.2–1.2)
Total Protein: 7 g/dL (ref 6.1–8.1)
eGFR: 93 mL/min/1.73m2 (ref >=60)

## 2023-11-08 LAB — RPR: RPR Ser Ql: NONREACTIVE

## 2023-11-08 LAB — CBC WITH DIFFERENTIAL/PLATELET
Absolute Lymphocytes: 2778 {cells}/uL (ref 850–3900)
Absolute Monocytes: 325 {cells}/uL (ref 200–950)
Basophils Absolute: 61 {cells}/uL (ref 0–200)
Basophils Relative: 1.1 %
Eosinophils Absolute: 110 {cells}/uL (ref 15–500)
Eosinophils Relative: 2 %
HCT: 37.2 % (ref 35.0–45.0)
Hemoglobin: 11.7 g/dL (ref 11.7–15.5)
MCH: 28.5 pg (ref 27.0–33.0)
MCHC: 31.5 g/dL — ABNORMAL LOW (ref 32.0–36.0)
MCV: 90.7 fL (ref 80.0–100.0)
MPV: 9.9 fL (ref 7.5–12.5)
Monocytes Relative: 5.9 %
Neutro Abs: 2228 {cells}/uL (ref 1500–7800)
Neutrophils Relative %: 40.5 %
Platelets: 308 Thousand/uL (ref 140–400)
RBC: 4.1 Million/uL (ref 3.80–5.10)
RDW: 11.9 % (ref 11.0–15.0)
Total Lymphocyte: 50.5 %
WBC: 5.5 Thousand/uL (ref 3.8–10.8)

## 2023-11-08 LAB — TSH: TSH: 1.53 m[IU]/L

## 2023-11-08 LAB — HIV ANTIBODY (ROUTINE TESTING W REFLEX): HIV 1&2 Ab, 4th Generation: NONREACTIVE

## 2023-11-13 LAB — CERVICOVAGINAL ANCILLARY ONLY
Chlamydia: NEGATIVE
Comment: NEGATIVE
Comment: NEGATIVE
Comment: NORMAL
Neisseria Gonorrhea: NEGATIVE
Trichomonas: NEGATIVE

## 2023-11-13 NOTE — Telephone Encounter (Signed)
 Could you check the status of this ? Its been a week

## 2023-12-08 ENCOUNTER — Encounter: Payer: Self-pay | Admitting: Allergy & Immunology

## 2023-12-08 ENCOUNTER — Ambulatory Visit (INDEPENDENT_AMBULATORY_CARE_PROVIDER_SITE_OTHER): Admitting: Allergy & Immunology

## 2023-12-08 ENCOUNTER — Other Ambulatory Visit: Payer: Self-pay

## 2023-12-08 ENCOUNTER — Other Ambulatory Visit (INDEPENDENT_AMBULATORY_CARE_PROVIDER_SITE_OTHER)

## 2023-12-08 VITALS — BP 100/70 | HR 74 | Temp 98.4°F | Ht 68.0 in | Wt 183.4 lb

## 2023-12-08 DIAGNOSIS — J310 Chronic rhinitis: Secondary | ICD-10-CM | POA: Diagnosis not present

## 2023-12-08 DIAGNOSIS — J4530 Mild persistent asthma, uncomplicated: Secondary | ICD-10-CM

## 2023-12-08 DIAGNOSIS — K219 Gastro-esophageal reflux disease without esophagitis: Secondary | ICD-10-CM | POA: Diagnosis not present

## 2023-12-08 LAB — HEPATIC FUNCTION PANEL
ALT: 12 U/L (ref 0–35)
AST: 15 U/L (ref 0–37)
Albumin: 4.4 g/dL (ref 3.5–5.2)
Alkaline Phosphatase: 40 U/L (ref 39–117)
Bilirubin, Direct: 0.3 mg/dL (ref 0.0–0.3)
Total Bilirubin: 1.7 mg/dL — ABNORMAL HIGH (ref 0.2–1.2)
Total Protein: 7.2 g/dL (ref 6.0–8.3)

## 2023-12-08 MED ORDER — AIRSUPRA 90-80 MCG/ACT IN AERO: 2.0000 | INHALATION_SPRAY | RESPIRATORY_TRACT | 2 refills | Status: AC | PRN

## 2023-12-08 NOTE — Patient Instructions (Addendum)
 1. Mild persistent asthma, uncomplicated - Lung testing looks excellent today, but we gave the 4 puffs of albuterol just to see if that helps. - There was a slight improvement, but since you did feel better, I want to go ahead and add albuterol to use as needed. - We are going to send in AirSupra , which contains albuterol combined with an inhaled steroid. - This helps the albuterol to be used more effectively by the lungs. - There is also a copay card. - Use two puffs every 4-6 hours as needed to see if this helps.   2. Chronic rhinitis - Because of insurance stipulations, we cannot do skin testing on the same day as your first visit. - We are all working to fight this, but for now we need to do two separate visits.  - We will know more after we do testing at the next visit.  - The skin testing visit can be squeezed in at your convenience.  - Then we can make a more full plan to address all of your symptoms. - Be sure to stop your antihistamines for 3 days before this appointment.   3. GERD  - I think a lot of the nighttime symptoms you are having are related to uncontrolled reflux.  - Start pantoprazole  40mg  at night. - Start famotidine  40 mg in the morning. - These two medications can work together to keep your reflux under better control.  4. Return in about 1 week (around 12/15/2023) for ALLERGY  TESTING (1-55). You can have the follow up appointment with Dr. Iva or a Nurse Practicioner (our Nurse Practitioners are excellent and always have Physician oversight!).    Please inform us  of any Emergency Department visits, hospitalizations, or changes in symptoms. Call us  before going to the ED for breathing or allergy  symptoms since we might be able to fit you in for a sick visit. Feel free to contact us  anytime with any questions, problems, or concerns.  It was a pleasure to meet you today!  Websites that have reliable patient information: 1. American Academy of Asthma, Allergy ,  and Immunology: www.aaaai.org 2. Food Allergy  Research and Education (FARE): foodallergy.org 3. Mothers of Asthmatics: http://www.asthmacommunitynetwork.org 4. [employer] of Allergy , Asthma, and Immunology: www.acaai.org      "Like" us  on Facebook and Instagram for our latest updates!      A healthy democracy works best when [employer] participate! Make sure you are registered to vote! If you have moved or changed any of your contact information, you will need to get this updated before voting! Scan the QR codes below to learn more!

## 2023-12-08 NOTE — Progress Notes (Signed)
 NEW PATIENT  Date of Service/Encounter:  12/08/23  Consult requested by: Daryl Setter, NP   Assessment:   Chronic rhinitis  Mild persistent asthma, uncomplicated   Gastroesophageal reflux disease  Plan/Recommendations:   1. Mild persistent asthma, uncomplicated - Lung testing looks excellent today, but we gave the 4 puffs of albuterol just to see if that helps. - There was a slight improvement, but since you did feel better, I want to go ahead and add albuterol to use as needed. - We are going to send in AirSupra , which contains albuterol combined with an inhaled steroid. - This helps the albuterol to be used more effectively by the lungs. - There is also a copay card. - Use two puffs every 4-6 hours as needed to see if this helps.   2. Chronic rhinitis - Because of insurance stipulations, we cannot do skin testing on the same day as your first visit. - We are all working to fight this, but for now we need to do two separate visits.  - We will know more after we do testing at the next visit.  - The skin testing visit can be squeezed in at your convenience.  - Then we can make a more full plan to address all of your symptoms. - Be sure to stop your antihistamines for 3 days before this appointment.   3. GERD  - I think a lot of the nighttime symptoms you are having are related to uncontrolled reflux.  - Start pantoprazole  40mg  at night. - Start famotidine  40 mg in the morning. - These two medications can work together to keep your reflux under better control.  4. Return in about 1 week (around 12/15/2023) for ALLERGY  TESTING (1-55). You can have the follow up appointment with Dr. Iva or a Nurse Practicioner (our Nurse Practitioners are excellent and always have Physician oversight!).    This note in its entirety was forwarded to the Provider who requested this consultation.  Subjective:   Elizabeth Harmon is a 23 y.o. 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  Discussed the use of AI scribe software for clinical note transcription with the patient and/or guardian, who gave verbal consent to proceed.  Elizabeth Harmon was referred by Daryl Setter, NP.     Elizabeth Harmon is a 23 y.o. female presenting for an evaluation of asthma and allergies.   Asthma/Respiratory Symptom History: She experiences a sensation of pressure on her chest and difficulty breathing, which has been ongoing for several years but has worsened recently. The difficulty in taking deep breaths is particularly noticeable at night when lying down, necessitating her to sit up to  breathe properly. She has not used albuterol or asthma medications previously.  Allergic Rhinitis Symptom History: She reports a sensation of mucus in her throat that is difficult to expectorate due to its thickness. When she is able to cough it up, the mucus is described as thick, brownish, and yellow. She has a history of sneezing and a runny nose, but allergy  medications such as Flonase  and Singulair  have not been effective in alleviating her symptoms. She has a history of undergoing skin testing for  allergies, which revealed sensitivities to some trees and cockroach allergens.   She underwent a tonsillectomy in December 2021 due to large tonsils, which provided temporary relief for about a month before symptoms returned. She has seen multiple specialists, including an ENT, for the persistent sensation of something stuck in her throat and excessive mucus production, but no significant findings were reported.  Food Allergy  Symptom History: She experiences mild oral itching when consuming peanuts, though she continues to eat peanut  butter regularly without significant issues.  GERD Symptom History: She has a history of heartburn and was previously on omeprazole , which was discontinued due to lack of efficacy. She has not been on any other heartburn medications since then.  Otherwise, there is no history of other atopic diseases, including drug allergies, stinging insect allergies, or contact dermatitis. There is no significant infectious history. Vaccinations are up to date.    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 If you have any questions, ask your nurse or doctor.          Airsupra  90-80 MCG/ACT Aero Generic drug: Albuterol-Budesonide Inhale 2 puffs into the lungs every 4 (four)  hours as needed. Started by: Miroslava Santellan Louis Elian Gloster   fluticasone  50 MCG/ACT nasal spray Commonly known as: FLONASE  Place 2 sprays into both nostrils daily.   loratadine  10 MG tablet Commonly known as: CLARITIN  Take 1 tablet (10 mg total) by mouth daily.   montelukast  10 MG tablet Commonly known as: SINGULAIR  Take 1 tablet (10 mg total) by mouth at bedtime.        Birth History: non-contributory  Developmental History: non-contributory  Past Surgical History: Past Surgical History:  Procedure Laterality Date   EYE SURGERY     tear duct   OTHER SURGICAL HISTORY Bilateral    tear duct    TONSILLECTOMY Bilateral 04/19/2020   Procedure: TONSILLECTOMY;  Surgeon: Llewellyn Gerard LABOR, DO;  Location: [facility];  Service: ENT;  Laterality: Bilateral;   TONSILLECTOMY       Family History: Family History  Problem Relation Age of Onset   Asthma Mother    Allergic rhinitis Mother    Hyperthyroidism Mother    Diabetes Father    Hyperlipidemia Father  Hypertension Father    Heart attack Maternal Uncle    Cancer Paternal Aunt        lung   Heart attack Paternal Uncle    Cancer Paternal Uncle        lung   Cancer Maternal Grandfather        prostate   Arthritis Maternal Grandfather    Asthma Brother    Allergic rhinitis Brother    Sudden death Neg Hx     Social History: Elizabeth Harmon lives at home with her family.  She lives in an apartment that is [address] years old.  There is hardwood in the main living areas and carpeting in the bedroom.  She has a heat pump for heating and central cooling.  There are no animals inside or outside of the home.  There are dust mite covers on the bed, but not the pillows.  There is no tobacco exposure.  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  There is no fume, chemical, or dust exposure.   Review of systems otherwise negative other than that mentioned in the HPI.    Objective:   Blood pressure 100/70, pulse 74,  temperature 98.4 F (36.9 C), height 5' 8 (1.727 m), weight 183 lb 6.4 oz (83.2 kg), SpO2 100%. Body mass index is 27.89 kg/m.     Physical Exam Vitals reviewed.  Constitutional:      Appearance: She is well-developed.     Comments: Friendly.   HENT:     Head: Normocephalic and atraumatic.     Right Ear: Tympanic membrane, ear canal and external ear normal. No drainage, swelling or tenderness. Tympanic membrane is not injected, scarred, erythematous, retracted or bulging.     Left Ear: Tympanic membrane, ear canal and external ear normal. No drainage, swelling or tenderness. Tympanic membrane is not injected, scarred, erythematous, retracted or bulging.     Nose: No nasal deformity, septal deviation, mucosal edema or rhinorrhea.     Right Turbinates: Enlarged, swollen and pale.     Left Turbinates: Enlarged, swollen and pale.     Right Sinus: No maxillary sinus tenderness or frontal sinus tenderness.     Left Sinus: No maxillary sinus tenderness or frontal sinus tenderness.     Mouth/Throat:     Mouth: Mucous membranes are not pale and not dry.     Pharynx: Uvula midline.  Eyes:     General:        Right eye: No discharge.        Left eye: No discharge.     Conjunctiva/sclera: Conjunctivae normal.     Right eye: Right conjunctiva is not injected. No chemosis.    Left eye: Left conjunctiva is not injected. No chemosis.    Pupils: Pupils are equal, round, and reactive to light.  Cardiovascular:     Rate and Rhythm: Normal rate and regular rhythm.     Heart sounds: Normal heart sounds.  Pulmonary:     Effort: Pulmonary effort is normal. No tachypnea, accessory muscle usage or respiratory distress.     Breath sounds: Normal breath sounds. No stridor or decreased air movement. No wheezing, rhonchi or rales.     Comments: Moving air well Chest:     Chest wall: No tenderness.  Abdominal:     Tenderness: There is no abdominal tenderness. There is no guarding or rebound.   Lymphadenopathy:     Head:     Right side of head: No submandibular, tonsillar  or occipital adenopathy.     Left side of head: No submandibular, tonsillar or occipital adenopathy.     Cervical: No cervical adenopathy.  Skin:    Coloration: Skin is not pale.     Findings: No abrasion, erythema, petechiae or rash. Rash is not papular, urticarial or vesicular.  Neurological:     Mental Status: She is alert.  Psychiatric:        Behavior: Behavior is cooperative.      Diagnostic studies:    Spirometry: results normal (FEV1: 3.45/106%, FVC: 4.34/116%, FEV1/FVC: 79%).    Spirometry consistent with normal pattern. Xopenex four puffs via MDI treatment given in clinic with improvement in FEV1 and FVC, but not significant per ATS criteria. She did feel that her shortness of breathing and chest tightness were better.   Allergy  Studies: deferred due to insurance stipulations that require a separate visit for testing          Marty Shaggy, MD [facility] 

## 2023-12-09 LAB — BILIRUBIN, FRACTIONATED(TOT/DIR/INDIR)
Bilirubin, Direct: 0 mg/dL (ref 0.0–0.2)
Indirect Bilirubin: 0.7 mg/dL (ref 0.2–1.2)
Total Bilirubin: 0.7 mg/dL (ref 0.2–1.2)

## 2023-12-11 ENCOUNTER — Ambulatory Visit: Payer: Self-pay | Admitting: Family

## 2024-01-15 ENCOUNTER — Telehealth: Payer: Self-pay

## 2024-01-15 ENCOUNTER — Ambulatory Visit (INDEPENDENT_AMBULATORY_CARE_PROVIDER_SITE_OTHER): Admitting: Otolaryngology

## 2024-01-15 VITALS — BP 109/71 | HR 70 | Temp 97.7°F | Resp 96 | Ht 69.0 in | Wt 185.0 lb

## 2024-01-15 DIAGNOSIS — R0982 Postnasal drip: Secondary | ICD-10-CM | POA: Diagnosis not present

## 2024-01-15 DIAGNOSIS — J352 Hypertrophy of adenoids: Secondary | ICD-10-CM

## 2024-01-15 DIAGNOSIS — R0981 Nasal congestion: Secondary | ICD-10-CM | POA: Diagnosis not present

## 2024-01-15 DIAGNOSIS — J3089 Other allergic rhinitis: Secondary | ICD-10-CM

## 2024-01-15 DIAGNOSIS — K219 Gastro-esophageal reflux disease without esophagitis: Secondary | ICD-10-CM | POA: Diagnosis not present

## 2024-01-15 DIAGNOSIS — R09A2 Foreign body sensation, throat: Secondary | ICD-10-CM | POA: Diagnosis not present

## 2024-01-15 DIAGNOSIS — R196 Halitosis: Secondary | ICD-10-CM

## 2024-01-15 MED ORDER — FAMOTIDINE 20 MG PO TABS: 20.0000 mg | ORAL_TABLET | Freq: Two times a day (BID) | ORAL | 3 refills | Status: AC

## 2024-01-15 MED ORDER — LEVOCETIRIZINE DIHYDROCHLORIDE 5 MG PO TABS: 5.0000 mg | ORAL_TABLET | Freq: Every evening | ORAL | 3 refills | Status: AC

## 2024-01-15 NOTE — Telephone Encounter (Signed)
 She needs updated testing. Please schedule next week for allergy  testing. OK to overbook if needed.  Marty Shaggy, MD [facility] 

## 2024-01-15 NOTE — Patient Instructions (Addendum)
 [employer] - check out this website to learn more about reflux   -Avoid lying down for at least two hours after a meal or after drinking acidic beverages, like soda, or other caffeinated beverages. This can help to prevent stomach contents from flowing back into the esophagus. -Keep your head elevated while you sleep. Using an extra pillow or two can also help to prevent reflux. -Eat smaller and more frequent meals each day instead of a few large meals. This promotes digestion and can aid in preventing heartburn. -Wear loose-fitting clothes to ease pressure on the stomach, which can worsen heartburn and reflux. -Reduce excess weight around the midsection. This can ease pressure on the stomach. Such pressure can force some stomach contents back up the esophagus   - Take Reflux Gourmet (natural supplement available on Amazon) to help with symptoms of chronic throat irritation      Aureliano Med Nasal Saline Rinse   - start nasal saline rinses with NeilMed Bottle available over the counter or online to help with nasal congestion

## 2024-01-15 NOTE — Progress Notes (Signed)
 ENT CONSULT:  Reason for Consult: globus globus sensation    HPI: Discussed the use of AI scribe software for clinical note transcription with the patient, who gave verbal consent to proceed.  History of Present Illness Elizabeth Harmon is a 23 year old female who presents with a sensation of pressure in her throat. She has experienced a sensation of pressure in her throat for the past three years, describing it as feeling like 'something's just stuck there,' similar to a lump that she cannot cough up. Various treatments, including proton pump inhibitors (PPIs), dietary changes, and altering her sleeping position, have not alleviated the sensation.  She coughs up thick mucus that is greenish-yellow to brown in color. Despite being tested for allergies in the past and currently taking Flonase , Claritin , and Singulair , she does not feel these medications are effective. Her last allergy  testing was over a year ago.  She experiences bad breath despite maintaining good oral hygiene, including brushing her teeth twice a day, using mouthwash, and flossing.  She mentions experiencing chest tightness and discomfort, particularly at night, which makes it difficult to breathe. She has been prescribed albuterol in the past. No improvement in her allergy  symptoms with current medications. She reports difficulty breathing at night and chest tightness. She was given albuterol for suspected asthma.   She had tonsillectomy when she was young.   Records Reviewed:  PCP office visit Melissa O'Sullivan  SOB (shortness of breath)     EKG tracing is personally reviewed.  EKG notes NSR- sinus brady.  No acute changes.    I wonder if her symptoms may be asthma related. Will refer to allergist for further evaluation.           Relevant Orders    Ambulatory referral to Allergy     Near syncope - Primary    Lab work unremarkable.  EKG notes sinus brady- doubt bradycardia is contributor at 58 bpm. No significant  orthostasis.  Encourage hydration. Monitor.         Relevant Orders    Comp Met (CMET) (Completed)    CBC w/Diff (Completed)    TSH (Completed)    EKG 12-Lead (Completed)    Globus sensation    Did not improve with PPI.  Will refer to ENT for further evaluation.        Relevant Orders    Ambulatory referral to ENT    Allergic rhinitis    No significant improvement with flonase  singulair  or antihistamines.  Will refer to allergist.           No past medical history on file.  Past Surgical History:  Procedure Laterality Date   EYE SURGERY     tear duct   OTHER SURGICAL HISTORY Bilateral    tear duct    TONSILLECTOMY Bilateral 04/19/2020   Procedure: TONSILLECTOMY;  Surgeon: Llewellyn Gerard LABOR, DO;  Location:  SURGERY CENTER;  Service: ENT;  Laterality: Bilateral;   TONSILLECTOMY      Family History  Problem Relation Age of Onset   Asthma Mother    Allergic rhinitis Mother    Hyperthyroidism Mother    Diabetes Father    Hyperlipidemia Father    Hypertension Father    Heart attack Maternal Uncle    Cancer Paternal Aunt        lung   Heart attack Paternal Uncle    Cancer Paternal Uncle        lung   Cancer Maternal Grandfather  prostate   Arthritis Maternal Grandfather    Asthma Brother    Allergic rhinitis Brother    Sudden death Neg Hx     Social History:  reports that she has never smoked. She has never used smokeless tobacco. She reports that she does not drink alcohol and does not use drugs.  Allergies: No Known Allergies  Medications: I have reviewed the patient's current medications.  The PMH, PSH, Medications, Allergies, and SH were reviewed and updated.  ROS: Constitutional: Negative for fever, weight loss and weight gain. Cardiovascular: Negative for chest pain and dyspnea on exertion. Respiratory: Is not experiencing shortness of breath at rest. Gastrointestinal: Negative for nausea and vomiting. Neurological: Negative for  headaches. Psychiatric: The patient is not nervous/anxious  Blood pressure 109/71, pulse 70, temperature 97.7 F (36.5 C), temperature source Oral, resp. rate (!) 96, height 5' 9 (1.753 m), weight 185 lb (83.9 kg). Body mass index is 27.32 kg/m.  PHYSICAL EXAM:  Exam: General: Well-developed, well-nourished Communication and Voice: Clear pitch and clarity Respiratory Respiratory effort: Equal inspiration and expiration without stridor Cardiovascular Peripheral Vascular: Warm extremities with equal color/perfusion Eyes: No nystagmus with equal extraocular motion bilaterally Neuro/Psych/Balance: Patient oriented to person, place, and time; Appropriate mood and affect; Gait is intact with no imbalance; Cranial nerves I-XII are intact Head and Face Inspection: Normocephalic and atraumatic without mass or lesion Palpation: Facial skeleton intact without bony stepoffs Salivary Glands: No mass or tenderness Facial Strength: Facial motility symmetric and full bilaterally ENT Pinna: External ear intact and fully developed External canal: Canal is patent with intact skin Tympanic Membrane: Clear and mobile External Nose: No scar or anatomic deformity Internal Nose: Septum is deviated to the left. No polyp, or purulence. Mucosal edema and erythema present.  Bilateral inferior turbinate hypertrophy.  Lips, Teeth, and gums: Mucosa and teeth intact and viable TMJ: No pain to palpation with full mobility Oral cavity/oropharynx: No erythema or exudate, no lesions present Nasopharynx: No mass or lesion with intact mucosa Hypopharynx: Intact mucosa without pooling of secretions Larynx Glottic: Full true vocal cord mobility without lesion or mass Supraglottic: Normal appearing epiglottis and AE folds Interarytenoid Space: Moderate pachydermia&edema Subglottic Space: Patent without lesion or edema Neck Neck and Trachea: Midline trachea without mass or lesion Thyroid : No mass or  nodularity Lymphatics: No lymphadenopathy  Procedure: Preoperative diagnosis:chronic globus sensation   Postoperative diagnosis:   Same adenoid hypertrophy and GERD LPR  Procedure: Flexible fiberoptic laryngoscopy  Surgeon: Elena Larry, MD  Anesthesia: Topical lidocaine  and Afrin Complications: None Condition is stable throughout exam  Indications and consent:  The patient presents to the clinic with above symptoms. Indirect laryngoscopy view was incomplete. Thus it was recommended that they undergo a flexible fiberoptic laryngoscopy. All of the risks, benefits, and potential complications were reviewed with the patient preoperatively and verbal informed consent was obtained.  Procedure: The patient was seated upright in the clinic. Topical lidocaine  and Afrin were applied to the nasal cavity. After adequate anesthesia had occurred, I then proceeded to pass the flexible telescope into the nasal cavity. The nasal cavity was patent without rhinorrhea or polyp. The nasopharynx was also patent without mass or lesion. The base of tongue was visualized and was normal. There were no signs of pooling of secretions in the piriform sinuses. The true vocal folds were mobile bilaterally. There were no signs of glottic or supraglottic mucosal lesion or mass. There was moderate interarytenoid pachydermia and post cricoid edema. The telescope was then slowly withdrawn and  the patient tolerated the procedure throughout.   Studies Reviewed: CXR 10/27/23 - normal study   Assessment/Plan: Encounter Diagnoses  Name Primary?   Chronic nasal congestion Yes   Environmental and seasonal allergies    Post-nasal drip    Chronic GERD    Globus sensation    Adenoid hypertrophy     Assessment and Plan Assessment & Plan Chronic throat globus sensation Chronic sensation of a lump in the throat for three years. Flexible scope exam with adenoid hypertrophy otherwise no masses or lesions Possible causes  include reflux changes or irritation from postnasal drainage due to allergies. - Prescribed Pepcid  for reflux management. - Recommend Reflux Gourmet supplement, a seaweed paste, to be taken after meals. - management of allergies  Laryngopharyngeal reflux symptoms Reflux changes contributing to throat symptoms. Previously managed with PPIs and lifestyle modifications without relief. - Pepcid  20 mg BID  -  Reflux Gourmet after meals - diet and lifestyle changes to minimize GERD - Refer to [employer] blog for dietary and lifestyle modifications/reflux cook book  Chronic nasal congestion allergic rhinitis with postnasal drip Chronic allergic rhinitis with postnasal drip, not well controlled with current medications. Adenoid hypertrophy observed, indicating chronic allergies. - Refer to allergy  specialist for possible retesting and consideration of allergy  shots. - Prescribed Xyzal  5 mg daily as an alternative to Claritin . - Recommend nasal saline rinsing. - continue Flonase    Halitosis Persistent bad breath despite good oral hygiene. Possible relation to gut bacteria or reflux, though reflux is less likely as a cause. Already had tonsillectomy  - Consider referral to GI for evaluation of bad breath and possible gut bacteria involvement. She will discuss with PCP - Discussed probiotic use       Thank you for allowing me to participate in the care of this patient. Please do not hesitate to contact me with any questions or concerns.   Elena Larry, MD Otolaryngology [facility] Phone: [phone] Fax: [phone]    01/15/2024, 9:13 AM

## 2024-01-15 NOTE — Telephone Encounter (Signed)
 Patient has been scheduled for her [facility] appointment on October 17th. Dr. Iva please send in the prescription for this patient so vials can be made. Thank you!

## 2024-01-15 NOTE — Telephone Encounter (Signed)
 I called the patient. Informed no openings with Dr.Gallagher next week. The patient is okay with seeing the NP for allergy  testing. Allergy  Testing appt has been scheduled.

## 2024-01-19 NOTE — Patient Instructions (Incomplete)
 Asthma Continue Airsupra  2 puffs as needed for cough or wheeze.  Do not use Airsupra  more than 12 puffs in a 24-hour time span  Allergic rhinitis Your skin testing Consider allergen immunotherapy if your symptoms are not well-controlled with the treatment plan as listed above  Reflux Continue dietary lifestyle modifications as listed below Continue pantoprazole  40 mg a day and famotidine  40 mg a day as previously prescribed  Call the clinic if this treatment plan is not working well for you.  Follow up in *** or sooner if needed.   Lifestyle Changes for Controlling GERD When you have GERD, stomach acid feels as if it's backing up toward your mouth. Whether or not you take medication to control your GERD, your symptoms can often be improved with lifestyle changes.   Raise Your Head Reflux is more likely to strike when you're lying down flat, because stomach fluid can flow backward more easily. Raising the head of your bed 4-6 inches can help. To do this: Slide blocks or books under the legs at the head of your bed. Or, place a wedge under the mattress. Many foam stores can make a suitable wedge for you. The wedge should run from your waist to the top of your head. Don't just prop your head on several pillows. This increases pressure on your stomach. It can make GERD worse.  Watch Your Eating Habits Certain foods may increase the acid in your stomach or relax the lower esophageal sphincter, making GERD more likely. It's best to avoid the following: Coffee, tea, and carbonated drinks (with and without caffeine) Fatty, fried, or spicy food Mint, chocolate, onions, and tomatoes Any other foods that seem to irritate your stomach or cause you pain  Relieve the Pressure Eat smaller meals, even if you have to eat more often. Don't lie down right after you eat. Wait a few hours for your stomach to empty. Avoid tight belts and tight-fitting clothes. Lose excess weight.  Tobacco and  Alcohol Avoid smoking tobacco and drinking alcohol. They can make GERD symptoms worse.

## 2024-01-19 NOTE — Progress Notes (Unsigned)
   [address] [place] [address] Dept: [phone]  FOLLOW UP NOTE  Patient ID: Elizabeth Harmon, female    DOB: [birth year]  Age: 23 y.o. MRN: [id number] Date of Office Visit: 01/21/2024  Assessment  Chief Complaint: No chief complaint on file.  HPI Elizabeth Harmon is a 23 year old female who presents to the clinic for follow-up visit with skin testing.  She was last seen in this clinic as a new patient on 12/08/2023 by Dr. Iva for evaluation of asthma, chronic rhinitis, and reflux.  Discussed the use of AI scribe software for clinical note transcription with the patient, who gave verbal consent to proceed.  History of Present Illness      Drug Allergies:  No Known Allergies  Physical Exam: There were no vitals taken for this visit.   Physical Exam  Diagnostics:    Assessment and Plan: No diagnosis found.  No orders of the defined types were placed in this encounter.   There are no Patient Instructions on file for this visit.  No follow-ups on file.    Thank you for the opportunity to care for this patient.  Please do not hesitate to contact me with questions.  Arlean Mutter, FNP [facility]

## 2024-01-20 ENCOUNTER — Ambulatory Visit: Admitting: Family

## 2024-01-21 ENCOUNTER — Ambulatory Visit: Admitting: Family Medicine

## 2024-01-21 ENCOUNTER — Encounter: Payer: Self-pay | Admitting: Family Medicine

## 2024-01-21 DIAGNOSIS — J302 Other seasonal allergic rhinitis: Secondary | ICD-10-CM | POA: Diagnosis not present

## 2024-01-21 DIAGNOSIS — K219 Gastro-esophageal reflux disease without esophagitis: Secondary | ICD-10-CM | POA: Insufficient documentation

## 2024-01-21 DIAGNOSIS — J4530 Mild persistent asthma, uncomplicated: Secondary | ICD-10-CM | POA: Insufficient documentation

## 2024-01-21 DIAGNOSIS — J3089 Other allergic rhinitis: Secondary | ICD-10-CM | POA: Diagnosis not present

## 2024-01-28 ENCOUNTER — Other Ambulatory Visit: Payer: Self-pay | Admitting: Allergy & Immunology

## 2024-01-28 DIAGNOSIS — J3081 Allergic rhinitis due to animal (cat) (dog) hair and dander: Secondary | ICD-10-CM | POA: Diagnosis not present

## 2024-01-28 DIAGNOSIS — J302 Other seasonal allergic rhinitis: Secondary | ICD-10-CM | POA: Diagnosis not present

## 2024-01-28 DIAGNOSIS — J3089 Other allergic rhinitis: Secondary | ICD-10-CM

## 2024-01-28 DIAGNOSIS — J301 Allergic rhinitis due to pollen: Secondary | ICD-10-CM | POA: Diagnosis not present

## 2024-01-28 NOTE — Progress Notes (Signed)
 VIALS MADE 01-28-24

## 2024-01-28 NOTE — Progress Notes (Unsigned)
 Allergen immunotherapy shots prescription written.

## 2024-01-28 NOTE — Progress Notes (Signed)
 Aeroallergen Immunotherapy  Ordering Provider: Dr. Marty Shaggy  Patient Details Name: Elizabeth Harmon MRN: [id number] Date of Birth: [birth year]  Order 2 of 2  Vial Label: M/DM/CR  0.2 ml (Volume)  1:20 Concentration -- Bipolaris sorokiniana 0.2 ml (Volume)  1:20 Concentration -- Drechslera spicifera 0.2 ml (Volume)  1:10 Concentration -- Mucor plumbeus 0.2 ml (Volume)  1:10 Concentration -- Fusarium moniliforme 0.2 ml (Volume)  1:40 Concentration -- Aureobasidium pullulans 0.2 ml (Volume)  1:10 Concentration -- Rhizopus oryzae 0.3 ml (Volume)  1:20 Concentration -- Cockroach, German 1.0 ml (Volume)   AU Concentration -- Mite Mix (DF 5,000 & DP 5,000)   2.5  ml Extract Subtotal 2.5  ml Diluent  5.0  ml Maintenance Total  Schedule:  B  Blue Vial (1:100,000): Schedule B (6 doses) Yellow Vial (1:10,000): Schedule B (6 doses) Green Vial (1:1,000): Schedule B (6 doses) Red Vial (1:100): Schedule A (12 doses)  Special Instructions: After completion of the first Red Vial, please space to every two weeks. After completion of the second Red Vial, please space to every 4 weeks. Ok to up dose new vials at 0.73mL --> 0.3 mL --> 0.5 mL. Ok to come twice weekly, if desired, as long as there is 48 hours between injections.

## 2024-01-28 NOTE — Progress Notes (Signed)
 Aeroallergen Immunotherapy  Ordering Provider: Dr. Marty Shaggy  Patient Details Name: Elizabeth Harmon MRN: [id number] Date of Birth: 03-Nov-2000  Order 1 of 2  Vial Label: G/W/T/D  0.3 ml (Volume)  BAU Concentration -- 7 Grass Mix* 100,000 ([place]  Blue, [place], [place], Perennial Rye, RedTop, Sweet Vernal, Timothy) 0.2 ml (Volume)  1:20 Concentration -- [place] 0.3 ml (Volume)  BAU Concentration -- [place] 10,000 0.2 ml (Volume)  1:20 Concentration -- Johnson 0.2 ml (Volume)  1:40 Concentration -- Baccharis 0.5 ml (Volume)  1:20 Concentration -- Eastern 10 Tree Mix (also Sweet Gum) 1.0 ml (Volume)  1:10 Concentration -- Dog Epithelia   2.7  ml Extract Subtotal 2.3  ml Diluent  5.0  ml Maintenance Total  Schedule:  B  Blue Vial (1:100,000): Schedule B (6 doses) Yellow Vial (1:10,000): Schedule B (6 doses) Green Vial (1:1,000): Schedule B (6 doses) Red Vial (1:100): Schedule A (12 doses)  Special Instructions: After completion of the first Red Vial, please space to every two weeks. After completion of the second Red Vial, please space to every 4 weeks. Ok to up dose new vials at 0.68mL --> 0.3 mL --> 0.5 mL. Ok to come twice weekly, if desired, as long as there is 48 hours between injections.

## 2024-02-05 ENCOUNTER — Ambulatory Visit (INDEPENDENT_AMBULATORY_CARE_PROVIDER_SITE_OTHER)

## 2024-02-05 DIAGNOSIS — J309 Allergic rhinitis, unspecified: Secondary | ICD-10-CM

## 2024-02-05 MED ORDER — EPINEPHRINE 0.3 MG/0.3ML IJ SOAJ: 0.3000 mg | INTRAMUSCULAR | 1 refills | Status: AC | PRN

## 2024-02-05 NOTE — Progress Notes (Signed)
 Immunotherapy   Patient Details  Name: Elizabeth Harmon MRN: [id number] Date of Birth: 30-Jul-2000  02/05/2024  Elizabeth Harmon started injections for  Mold-Dustmite-Cockroach & Arlin Following schedule: B  Frequency: 1-2x weekly Epi-Pen: Prescription for Epi-Pen given Consent signed and patient instructions given. Patient waited in office 30 minutes with no issues.    Isaiah LITTIE Shed 02/05/2024, 10:11 AM

## 2024-02-09 ENCOUNTER — Ambulatory Visit (INDEPENDENT_AMBULATORY_CARE_PROVIDER_SITE_OTHER)

## 2024-02-09 DIAGNOSIS — J309 Allergic rhinitis, unspecified: Secondary | ICD-10-CM

## 2024-02-16 ENCOUNTER — Ambulatory Visit

## 2024-02-16 DIAGNOSIS — J309 Allergic rhinitis, unspecified: Secondary | ICD-10-CM | POA: Diagnosis not present

## 2024-02-24 ENCOUNTER — Ambulatory Visit

## 2024-02-24 DIAGNOSIS — J309 Allergic rhinitis, unspecified: Secondary | ICD-10-CM | POA: Diagnosis not present

## 2024-03-02 ENCOUNTER — Ambulatory Visit (INDEPENDENT_AMBULATORY_CARE_PROVIDER_SITE_OTHER)

## 2024-03-02 DIAGNOSIS — J309 Allergic rhinitis, unspecified: Secondary | ICD-10-CM

## 2024-03-07 ENCOUNTER — Ambulatory Visit

## 2024-03-07 DIAGNOSIS — J301 Allergic rhinitis due to pollen: Secondary | ICD-10-CM | POA: Diagnosis not present

## 2024-03-07 DIAGNOSIS — R0982 Postnasal drip: Secondary | ICD-10-CM | POA: Diagnosis not present

## 2024-03-07 DIAGNOSIS — J3081 Allergic rhinitis due to animal (cat) (dog) hair and dander: Secondary | ICD-10-CM | POA: Diagnosis not present

## 2024-03-07 DIAGNOSIS — J309 Allergic rhinitis, unspecified: Secondary | ICD-10-CM

## 2024-03-07 DIAGNOSIS — J3089 Other allergic rhinitis: Secondary | ICD-10-CM | POA: Diagnosis not present

## 2024-03-22 ENCOUNTER — Ambulatory Visit: Admitting: Allergy & Immunology

## 2024-03-23 ENCOUNTER — Ambulatory Visit

## 2024-03-23 DIAGNOSIS — J309 Allergic rhinitis, unspecified: Secondary | ICD-10-CM | POA: Diagnosis not present

## 2024-04-19 ENCOUNTER — Telehealth: Payer: Self-pay | Admitting: *Deleted

## 2024-04-19 NOTE — Telephone Encounter (Signed)
  [facility]  called back and needed advice on what dose to give her. Advised to repeat dose and give the patient .05mL of her Gold vials. They verbalized understanding and stated that they would do so. I called the patient and advised of the phone conversation, patient verbalized understanding.

## 2024-04-19 NOTE — Telephone Encounter (Signed)
 Patient called and stated that [facility]  has been trying to reach us  in regards to her allergy  injections that are from our office in regards to the next dose. I called and spoke with the receptionist who stated that they were going to call us  back to discuss.

## 2024-04-25 ENCOUNTER — Telehealth: Payer: Self-pay

## 2024-04-25 NOTE — Telephone Encounter (Signed)
 Staff from [facility] shot room called concerning a pt. Stating that they don't have pt blue vial and her last given shot was 03/23/24 on old yellow vial 1:10,000. Per Dr. Lorin pt needs to be back down. Please mix down to appropriate concentration and mail out.

## 2024-05-18 ENCOUNTER — Other Ambulatory Visit: Payer: Self-pay | Admitting: Family

## 2024-05-18 DIAGNOSIS — J309 Allergic rhinitis, unspecified: Secondary | ICD-10-CM
